# Patient Record
Sex: Female | Born: 1970 | Race: White | Hispanic: No | Marital: Married | Smoking: Never smoker
Health system: Southern US, Community
[De-identification: ages and names within clinical notes are randomized; demographics above are authoritative.]

## PROBLEM LIST (undated history)

## (undated) DIAGNOSIS — R51 Headache: Secondary | ICD-10-CM

## (undated) DIAGNOSIS — J45909 Unspecified asthma, uncomplicated: Secondary | ICD-10-CM

## (undated) DIAGNOSIS — F329 Major depressive disorder, single episode, unspecified: Secondary | ICD-10-CM

## (undated) DIAGNOSIS — IMO0001 Reserved for inherently not codable concepts without codable children: Secondary | ICD-10-CM

## (undated) DIAGNOSIS — M797 Fibromyalgia: Secondary | ICD-10-CM

## (undated) DIAGNOSIS — R519 Headache, unspecified: Secondary | ICD-10-CM

## (undated) DIAGNOSIS — E119 Type 2 diabetes mellitus without complications: Secondary | ICD-10-CM

## (undated) DIAGNOSIS — F419 Anxiety disorder, unspecified: Secondary | ICD-10-CM

## (undated) DIAGNOSIS — F32A Depression, unspecified: Secondary | ICD-10-CM

## (undated) DIAGNOSIS — K219 Gastro-esophageal reflux disease without esophagitis: Secondary | ICD-10-CM

## (undated) HISTORY — PX: TONSILLECTOMY: SUR1361

## (undated) HISTORY — PX: CHOLECYSTECTOMY: SHX55

---

## 1998-01-14 ENCOUNTER — Inpatient Hospital Stay (HOSPITAL_COMMUNITY): Admission: AD | Admit: 1998-01-14 | Discharge: 1998-01-17 | Payer: Self-pay | Admitting: Obstetrics & Gynecology

## 1998-03-11 ENCOUNTER — Other Ambulatory Visit: Admission: RE | Admit: 1998-03-11 | Discharge: 1998-03-11 | Payer: Self-pay | Admitting: Obstetrics and Gynecology

## 1999-12-12 ENCOUNTER — Other Ambulatory Visit: Admission: RE | Admit: 1999-12-12 | Discharge: 1999-12-12 | Payer: Self-pay | Admitting: Obstetrics and Gynecology

## 2015-07-15 NOTE — Patient Instructions (Signed)
Your procedure is scheduled on:  Wednesday, July 28, 2015  [facility]:  6:00 AM  Pick up the phone at the desk and dial [phone].  Call this number if you have problems the morning of surgery: [phone].  Remember:  Do NOT eat food or drink after:  Midnight Tuesday July 27, 2015  Take these medicines the morning of surgery with a SIP OF WATER: Wellbutrin, Cymbalta, Use Asthma inhalers on regular schedule  Bring Asthma inhalers day of surgery  Do NOT wear jewelry (body piercing), metal hair clips/bobby pins, make-up, or nail polish. Do NOT wear lotions, powders, or perfumes.  You may wear deodorant. Do NOT shave for 48 hours prior to surgery. Do NOT bring valuables to the hospital. Contacts, dentures, or bridgework may not be worn into surgery. Leave suitcase in car.  After surgery it may be brought to your room.  For patients admitted to the hospital, checkout time is 11:00 AM the day of discharge.

## 2015-07-19 ENCOUNTER — Encounter (HOSPITAL_COMMUNITY)
Admission: RE | Admit: 2015-07-19 | Discharge: 2015-07-19 | Disposition: A | Discharge status: Home / Self Care | Payer: BLUE CROSS/BLUE SHIELD | Source: Ambulatory Visit | Attending: Obstetrics and Gynecology | Admitting: Obstetrics and Gynecology

## 2015-07-19 ENCOUNTER — Encounter (HOSPITAL_COMMUNITY): Payer: Self-pay

## 2015-07-19 DIAGNOSIS — Z029 Encounter for administrative examinations, unspecified: Secondary | ICD-10-CM | POA: Insufficient documentation

## 2015-07-19 HISTORY — DX: Depression, unspecified: F32.A

## 2015-07-19 HISTORY — DX: Reserved for inherently not codable concepts without codable children: IMO0001

## 2015-07-19 HISTORY — DX: Headache: R51

## 2015-07-19 HISTORY — DX: Major depressive disorder, single episode, unspecified: F32.9

## 2015-07-19 HISTORY — DX: Gastro-esophageal reflux disease without esophagitis: K21.9

## 2015-07-19 HISTORY — DX: Unspecified asthma, uncomplicated: J45.909

## 2015-07-19 HISTORY — DX: Fibromyalgia: M79.7

## 2015-07-19 HISTORY — DX: Headache, unspecified: R51.9

## 2015-07-19 HISTORY — DX: Anxiety disorder, unspecified: F41.9

## 2015-07-19 LAB — CBC
HCT: 40.5 % (ref 36.0–46.0)
HEMOGLOBIN: 13.6 g/dL (ref 12.0–15.0)
MCH: 29.6 pg (ref 26.0–34.0)
MCHC: 33.6 g/dL (ref 30.0–36.0)
MCV: 88 fL (ref 78.0–100.0)
PLATELETS: 511 10*3/uL — AB (ref 150–400)
RBC: 4.6 MIL/uL (ref 3.87–5.11)
RDW: 14.1 % (ref 11.5–15.5)
WBC: 16.4 10*3/uL — ABNORMAL HIGH (ref 4.0–10.5)

## 2015-07-19 NOTE — Pre-Procedure Instructions (Signed)
CBC results routed to Dr. Jackelyn KnifeMeisinger via [facility]

## 2015-07-22 NOTE — Anesthesia Preprocedure Evaluation (Addendum)
Anesthesia Evaluation  Patient identified by MRN, date of birth, ID band Patient awake    Reviewed: Allergy & Precautions, NPO status , Patient's Chart, lab work & pertinent test results  Airway Mallampati: III       Dental  (+) Teeth Intact, Dental Advisory Given   Pulmonary neg pulmonary ROS,    breath sounds clear to auscultation       Cardiovascular negative cardio ROS   Rhythm:Regular     Neuro/Psych  Headaches, Anxiety Depression negative neurological ROS  negative psych ROS   GI/Hepatic negative GI ROS, Neg liver ROS, GERD  Medicated,  Endo/Other  negative endocrine ROSObesity, BMI 37  Renal/GU negative Renal ROS  negative genitourinary   Musculoskeletal negative musculoskeletal ROS (+) Fibromyalgia -  Abdominal (+) + obese,   Peds negative pediatric ROS (+)  Hematology negative hematology ROS (+)   Anesthesia Other Findings   Reproductive/Obstetrics negative OB ROS                          Anesthesia Physical Anesthesia Plan  ASA: II  Anesthesia Plan: General   Post-op Pain Management:    Induction: Intravenous  Airway Management Planned: Oral ETT  Additional Equipment:   Intra-op Plan:   Post-operative Plan: Extubation in OR  Informed Consent: I have reviewed the patients History and Physical, chart, labs and discussed the procedure including the risks, benefits and alternatives for the proposed anesthesia with the patient or authorized representative who has indicated his/her understanding and acceptance.     Plan Discussed with:   Anesthesia Plan Comments: (PG neg)      Anesthesia Quick Evaluation

## 2015-07-27 NOTE — H&P (Signed)
Meredith FergusonJennifer Olson is an 45 y.o. female. She was seen initially in February for PMB.  Menopausal for about 10 years, recent bleeding, normal Pap with endometrial cells by PCP.  Exam in the office was normal, pipelle benign.  She was treated with Aygestin, bleeding improved, but she then had recurrent bleeding and cramping, normal pelvic ultrasound.  Medical and surgical options have been discussed, she wants to proceed with definitive surgical therapy.    Pertinent Gynecological History: Last pap: normal Date: 2016 OB History: G3, P3003 SVD x 3   Menstrual History: No LMP recorded.    Past Medical History  Diagnosis Date  . Shortness of breath dyspnea     due to asthma  . Asthma   . Depression   . Anxiety   . GERD (gastroesophageal reflux disease)   . Headache     migraines  . Fibromyalgia     Past Surgical History  Procedure Laterality Date  . Cholecystectomy    . Tonsillectomy      No family history on file.  Social History:  reports that she has never smoked. She does not have any smokeless tobacco history on file. She reports that she does not drink alcohol or use illicit drugs.  Allergies:  Allergies  Allergen Reactions  . Latex Itching    Red rash  . Aspirin     Asthma flareup  . Ibuprofen     Asthma flare up  . Shellfish Allergy Hives    No prescriptions prior to admission    Review of Systems  Respiratory: Negative.   Cardiovascular: Negative.   Gastrointestinal: Negative.   Genitourinary: Negative.     There were no vitals taken for this visit. Physical Exam  Constitutional: She appears well-developed and well-nourished.  Neck: Neck supple. No thyromegaly present.  Cardiovascular: Normal rate, regular rhythm and normal heart sounds.   No murmur heard. Respiratory: Effort normal and breath sounds normal. No respiratory distress. She has no wheezes.  GI: Soft. She exhibits no distension and no mass. There is no tenderness.  Genitourinary: Vagina  normal and uterus normal.  No adnexal mass    No results found for this or any previous visit (from the past 24 hour(s)).  No results found.  Assessment/Plan: PMB and cramping, benign pipelle and normal ultrasound.  Medical and surgical options have been discussed, she wants definitive surgical therapy.  Surgical procedure, risks, chances of relieving symptoms have all been discussed.  She initially requested I remove her ovaries, discussed risks and benefits and that in general it is recommended to leave the ovaries in situ unless there is an abnormality.  [facility], possible cystoscopy.  She will make her final decision about whether or not to remove her ovaries on the day of surgery.  Gillian Kluever D 07/27/2015, 1:56 PM

## 2015-07-28 ENCOUNTER — Ambulatory Visit (HOSPITAL_COMMUNITY)
Admission: RE | Admit: 2015-07-28 | Discharge: 2015-07-29 | Disposition: A | Discharge status: Home / Self Care | Payer: BLUE CROSS/BLUE SHIELD | Source: Ambulatory Visit | Attending: Obstetrics and Gynecology | Admitting: Obstetrics and Gynecology

## 2015-07-28 ENCOUNTER — Encounter (HOSPITAL_COMMUNITY)
Admission: RE | Disposition: A | Discharge status: Home / Self Care | Payer: Self-pay | Source: Ambulatory Visit | Attending: Obstetrics and Gynecology

## 2015-07-28 ENCOUNTER — Ambulatory Visit (HOSPITAL_COMMUNITY): Payer: BLUE CROSS/BLUE SHIELD | Admitting: Anesthesiology

## 2015-07-28 DIAGNOSIS — Z6837 Body mass index (BMI) 37.0-37.9, adult: Secondary | ICD-10-CM | POA: Insufficient documentation

## 2015-07-28 DIAGNOSIS — N950 Postmenopausal bleeding: Secondary | ICD-10-CM | POA: Insufficient documentation

## 2015-07-28 DIAGNOSIS — M797 Fibromyalgia: Secondary | ICD-10-CM | POA: Diagnosis not present

## 2015-07-28 DIAGNOSIS — Z91013 Allergy to seafood: Secondary | ICD-10-CM | POA: Diagnosis not present

## 2015-07-28 DIAGNOSIS — Z90710 Acquired absence of both cervix and uterus: Secondary | ICD-10-CM | POA: Diagnosis present

## 2015-07-28 DIAGNOSIS — J45909 Unspecified asthma, uncomplicated: Secondary | ICD-10-CM | POA: Insufficient documentation

## 2015-07-28 DIAGNOSIS — Z91040 Latex allergy status: Secondary | ICD-10-CM | POA: Diagnosis not present

## 2015-07-28 DIAGNOSIS — E669 Obesity, unspecified: Secondary | ICD-10-CM | POA: Diagnosis not present

## 2015-07-28 DIAGNOSIS — G43909 Migraine, unspecified, not intractable, without status migrainosus: Secondary | ICD-10-CM | POA: Diagnosis not present

## 2015-07-28 DIAGNOSIS — F419 Anxiety disorder, unspecified: Secondary | ICD-10-CM | POA: Insufficient documentation

## 2015-07-28 DIAGNOSIS — N8302 Follicular cyst of left ovary: Secondary | ICD-10-CM | POA: Diagnosis not present

## 2015-07-28 DIAGNOSIS — Z886 Allergy status to analgesic agent status: Secondary | ICD-10-CM | POA: Diagnosis not present

## 2015-07-28 DIAGNOSIS — F329 Major depressive disorder, single episode, unspecified: Secondary | ICD-10-CM | POA: Insufficient documentation

## 2015-07-28 DIAGNOSIS — N840 Polyp of corpus uteri: Secondary | ICD-10-CM | POA: Insufficient documentation

## 2015-07-28 DIAGNOSIS — N888 Other specified noninflammatory disorders of cervix uteri: Secondary | ICD-10-CM | POA: Insufficient documentation

## 2015-07-28 DIAGNOSIS — N8301 Follicular cyst of right ovary: Secondary | ICD-10-CM | POA: Diagnosis not present

## 2015-07-28 DIAGNOSIS — K219 Gastro-esophageal reflux disease without esophagitis: Secondary | ICD-10-CM | POA: Insufficient documentation

## 2015-07-28 HISTORY — PX: VAGINAL HYSTERECTOMY: SHX2639

## 2015-07-28 HISTORY — PX: BILATERAL SALPINGECTOMY: SHX5743

## 2015-07-28 HISTORY — PX: CYSTOSCOPY: SHX5120

## 2015-07-28 LAB — PREGNANCY, URINE: Preg Test, Ur: NEGATIVE

## 2015-07-28 SURGERY — HYSTERECTOMY, VAGINAL
Anesthesia: General

## 2015-07-28 MED ORDER — LIDOCAINE HCL (CARDIAC) 20 MG/ML IV SOLN
INTRAVENOUS | Status: DC | PRN
  Administered 2015-07-28: 80 mg via INTRAVENOUS
  Administered 2015-07-28: 40 mg via INTRAVENOUS

## 2015-07-28 MED ORDER — FENTANYL CITRATE (PF) 100 MCG/2ML IJ SOLN
INTRAMUSCULAR | Status: AC
  Administered 2015-07-28: 50 ug via INTRAVENOUS
  Filled 2015-07-28: qty 2

## 2015-07-28 MED ORDER — FENTANYL CITRATE (PF) 250 MCG/5ML IJ SOLN
INTRAMUSCULAR | Status: AC
  Filled 2015-07-28: qty 5

## 2015-07-28 MED ORDER — VASOPRESSIN 20 UNIT/ML IV SOLN
INTRAVENOUS | Status: DC | PRN
  Administered 2015-07-28: 15 mL via INTRAMUSCULAR

## 2015-07-28 MED ORDER — BUPROPION HCL ER (XL) 150 MG PO TB24
150.0000 mg | ORAL_TABLET | Freq: Every day | ORAL | Status: DC
  Administered 2015-07-28: 150 mg via ORAL
  Filled 2015-07-28: qty 1

## 2015-07-28 MED ORDER — SUCCINYLCHOLINE CHLORIDE 20 MG/ML IJ SOLN
INTRAMUSCULAR | Status: DC | PRN
  Administered 2015-07-28: 120 mg via INTRAVENOUS

## 2015-07-28 MED ORDER — SUGAMMADEX SODIUM 200 MG/2ML IV SOLN
INTRAVENOUS | Status: AC
  Filled 2015-07-28: qty 2

## 2015-07-28 MED ORDER — ACETAMINOPHEN 10 MG/ML IV SOLN
1000.0000 mg | Freq: Once | INTRAVENOUS | Status: AC
  Administered 2015-07-28: 1000 mg via INTRAVENOUS
  Filled 2015-07-28: qty 100

## 2015-07-28 MED ORDER — SCOPOLAMINE 1 MG/3DAYS TD PT72
MEDICATED_PATCH | TRANSDERMAL | Status: AC
  Filled 2015-07-28: qty 1

## 2015-07-28 MED ORDER — PROPOFOL 10 MG/ML IV BOLUS
INTRAVENOUS | Status: AC
  Filled 2015-07-28: qty 20

## 2015-07-28 MED ORDER — DEXAMETHASONE SODIUM PHOSPHATE 10 MG/ML IJ SOLN
INTRAMUSCULAR | Status: DC | PRN
  Administered 2015-07-28: 4 mg via INTRAVENOUS

## 2015-07-28 MED ORDER — ONDANSETRON HCL 4 MG/2ML IJ SOLN: 4.0000 mg | Freq: Four times a day (QID) | INTRAMUSCULAR | Status: DC | PRN

## 2015-07-28 MED ORDER — DIPHENHYDRAMINE HCL 50 MG/ML IJ SOLN
INTRAMUSCULAR | Status: DC | PRN
  Administered 2015-07-28: 25 mg via INTRAVENOUS

## 2015-07-28 MED ORDER — ROCURONIUM BROMIDE 100 MG/10ML IV SOLN
INTRAVENOUS | Status: AC
  Filled 2015-07-28: qty 1

## 2015-07-28 MED ORDER — MOMETASONE FURO-FORMOTEROL FUM 100-5 MCG/ACT IN AERO
2.0000 | INHALATION_SPRAY | Freq: Two times a day (BID) | RESPIRATORY_TRACT | Status: DC
  Administered 2015-07-28 – 2015-07-29 (×2): 2 via RESPIRATORY_TRACT
  Filled 2015-07-28: qty 8.8

## 2015-07-28 MED ORDER — STERILE WATER FOR IRRIGATION IR SOLN
Status: DC | PRN
  Administered 2015-07-28: 3000 mL

## 2015-07-28 MED ORDER — MIDAZOLAM HCL 2 MG/2ML IJ SOLN
INTRAMUSCULAR | Status: AC
  Filled 2015-07-28: qty 2

## 2015-07-28 MED ORDER — MEPERIDINE HCL 25 MG/ML IJ SOLN: 6.2500 mg | INTRAMUSCULAR | Status: DC | PRN

## 2015-07-28 MED ORDER — DIPHENHYDRAMINE HCL 50 MG/ML IJ SOLN
INTRAMUSCULAR | Status: AC
  Filled 2015-07-28: qty 1

## 2015-07-28 MED ORDER — SCOPOLAMINE 1 MG/3DAYS TD PT72
1.0000 | MEDICATED_PATCH | Freq: Once | TRANSDERMAL | Status: DC
  Administered 2015-07-28: 1.5 mg via TRANSDERMAL

## 2015-07-28 MED ORDER — ROCURONIUM BROMIDE 100 MG/10ML IV SOLN
INTRAVENOUS | Status: DC | PRN
  Administered 2015-07-28: 10 mg via INTRAVENOUS
  Administered 2015-07-28: 5 mg via INTRAVENOUS

## 2015-07-28 MED ORDER — PROPOFOL 10 MG/ML IV BOLUS
INTRAVENOUS | Status: DC | PRN
  Administered 2015-07-28: 200 mg via INTRAVENOUS

## 2015-07-28 MED ORDER — ONDANSETRON HCL 4 MG PO TABS: 4.0000 mg | ORAL_TABLET | Freq: Four times a day (QID) | ORAL | Status: DC | PRN

## 2015-07-28 MED ORDER — LACTATED RINGERS IV SOLN
INTRAVENOUS | Status: DC
  Administered 2015-07-28 (×2): via INTRAVENOUS

## 2015-07-28 MED ORDER — SIMETHICONE 80 MG PO CHEW: 80.0000 mg | CHEWABLE_TABLET | Freq: Four times a day (QID) | ORAL | Status: DC | PRN

## 2015-07-28 MED ORDER — LIDOCAINE HCL (CARDIAC) 20 MG/ML IV SOLN
INTRAVENOUS | Status: AC
  Filled 2015-07-28: qty 5

## 2015-07-28 MED ORDER — ONDANSETRON HCL 4 MG/2ML IJ SOLN
INTRAMUSCULAR | Status: DC | PRN
  Administered 2015-07-28: 4 mg via INTRAVENOUS

## 2015-07-28 MED ORDER — ALBUTEROL SULFATE (2.5 MG/3ML) 0.083% IN NEBU: 3.0000 mL | INHALATION_SOLUTION | Freq: Four times a day (QID) | RESPIRATORY_TRACT | Status: DC | PRN

## 2015-07-28 MED ORDER — CEFAZOLIN SODIUM-DEXTROSE 2-3 GM-% IV SOLR
INTRAVENOUS | Status: AC
  Filled 2015-07-28: qty 50

## 2015-07-28 MED ORDER — DEXAMETHASONE SODIUM PHOSPHATE 4 MG/ML IJ SOLN
INTRAMUSCULAR | Status: AC
  Filled 2015-07-28: qty 1

## 2015-07-28 MED ORDER — SODIUM CHLORIDE 0.9 % IJ SOLN
INTRAMUSCULAR | Status: DC | PRN
  Administered 2015-07-28: 50 mL

## 2015-07-28 MED ORDER — SUCCINYLCHOLINE CHLORIDE 20 MG/ML IJ SOLN
INTRAMUSCULAR | Status: AC
  Filled 2015-07-28: qty 1

## 2015-07-28 MED ORDER — SCOPOLAMINE 1 MG/3DAYS TD PT72
MEDICATED_PATCH | TRANSDERMAL | Status: AC
  Administered 2015-07-28: 1.5 mg via TRANSDERMAL
  Filled 2015-07-28: qty 1

## 2015-07-28 MED ORDER — ALUM & MAG HYDROXIDE-SIMETH 200-200-20 MG/5ML PO SUSP: 30.0000 mL | ORAL | Status: DC | PRN

## 2015-07-28 MED ORDER — MENTHOL 3 MG MT LOZG: 1.0000 | LOZENGE | OROMUCOSAL | Status: DC | PRN

## 2015-07-28 MED ORDER — DEXTROSE-NACL 5-0.45 % IV SOLN
INTRAVENOUS | Status: DC
  Administered 2015-07-28 (×2): via INTRAVENOUS

## 2015-07-28 MED ORDER — OXYCODONE-ACETAMINOPHEN 5-325 MG PO TABS
1.0000 | ORAL_TABLET | ORAL | Status: DC | PRN
  Administered 2015-07-28 (×2): 1 via ORAL
  Administered 2015-07-28: 2 via ORAL
  Administered 2015-07-29: 1 via ORAL
  Filled 2015-07-28: qty 2
  Filled 2015-07-28 (×3): qty 1

## 2015-07-28 MED ORDER — ONDANSETRON HCL 4 MG/2ML IJ SOLN: 4.0000 mg | Freq: Once | INTRAMUSCULAR | Status: DC

## 2015-07-28 MED ORDER — SODIUM CHLORIDE 0.9 % IJ SOLN
INTRAMUSCULAR | Status: AC
  Filled 2015-07-28: qty 50

## 2015-07-28 MED ORDER — CEFAZOLIN SODIUM-DEXTROSE 2-4 GM/100ML-% IV SOLN
2.0000 g | INTRAVENOUS | Status: AC
  Administered 2015-07-28: 2 g via INTRAVENOUS
  Filled 2015-07-28: qty 100

## 2015-07-28 MED ORDER — HYDROMORPHONE HCL 1 MG/ML IJ SOLN: 1.0000 mg | INTRAMUSCULAR | Status: DC | PRN

## 2015-07-28 MED ORDER — ONDANSETRON HCL 4 MG/2ML IJ SOLN
INTRAMUSCULAR | Status: AC
  Filled 2015-07-28: qty 2

## 2015-07-28 MED ORDER — FENTANYL CITRATE (PF) 100 MCG/2ML IJ SOLN
INTRAMUSCULAR | Status: DC | PRN
  Administered 2015-07-28 (×2): 100 ug via INTRAVENOUS

## 2015-07-28 MED ORDER — VASOPRESSIN 20 UNIT/ML IV SOLN
INTRAVENOUS | Status: AC
  Filled 2015-07-28: qty 1

## 2015-07-28 MED ORDER — MONTELUKAST SODIUM 10 MG PO TABS
10.0000 mg | ORAL_TABLET | Freq: Every day | ORAL | Status: DC
  Administered 2015-07-28: 10 mg via ORAL
  Filled 2015-07-28: qty 1

## 2015-07-28 MED ORDER — RACEPINEPHRINE HCL 2.25 % IN NEBU
INHALATION_SOLUTION | RESPIRATORY_TRACT | Status: AC
  Filled 2015-07-28: qty 0.5

## 2015-07-28 MED ORDER — MIDAZOLAM HCL 2 MG/2ML IJ SOLN
INTRAMUSCULAR | Status: DC | PRN
  Administered 2015-07-28 (×2): 1 mg via INTRAVENOUS

## 2015-07-28 MED ORDER — BUPIVACAINE HCL (PF) 0.5 % IJ SOLN
INTRAMUSCULAR | Status: DC | PRN
  Administered 2015-07-28: 30 mL

## 2015-07-28 MED ORDER — DULOXETINE HCL 60 MG PO CPEP
60.0000 mg | ORAL_CAPSULE | Freq: Every day | ORAL | Status: DC
  Administered 2015-07-28 – 2015-07-29 (×2): 60 mg via ORAL
  Filled 2015-07-28 (×2): qty 1

## 2015-07-28 MED ORDER — BUPIVACAINE HCL (PF) 0.5 % IJ SOLN
INTRAMUSCULAR | Status: AC
  Filled 2015-07-28: qty 30

## 2015-07-28 MED ORDER — FENTANYL CITRATE (PF) 100 MCG/2ML IJ SOLN
25.0000 ug | INTRAMUSCULAR | Status: DC | PRN
  Administered 2015-07-28 (×2): 50 ug via INTRAVENOUS

## 2015-07-28 MED ORDER — SUGAMMADEX SODIUM 200 MG/2ML IV SOLN
INTRAVENOUS | Status: DC | PRN
  Administered 2015-07-28: 200 mg via INTRAVENOUS

## 2015-07-28 SURGICAL SUPPLY — 28 items
CANISTER SUCT 3000ML (MISCELLANEOUS) ×3 IMPLANT
CATH ROBINSON RED A/P 16FR (CATHETERS) ×6 IMPLANT
CLOTH BEACON ORANGE TIMEOUT ST (SAFETY) ×3 IMPLANT
CONT PATH 16OZ SNAP LID 3702 (MISCELLANEOUS) IMPLANT
DECANTER SPIKE VIAL GLASS SM (MISCELLANEOUS) ×1 IMPLANT
GLOVE BIO SURGEON STRL SZ8 (GLOVE) ×3 IMPLANT
GLOVE BIOGEL PI IND STRL 6.5 (GLOVE) ×2 IMPLANT
GLOVE BIOGEL PI IND STRL 7.0 (GLOVE) ×2 IMPLANT
GLOVE BIOGEL PI INDICATOR 6.5 (GLOVE) ×1
GLOVE BIOGEL PI INDICATOR 7.0 (GLOVE) ×1
GLOVE ORTHO TXT STRL SZ7.5 (GLOVE) ×3 IMPLANT
GOWN SPEC L3 XXLG W/TWL (GOWN DISPOSABLE) ×3 IMPLANT
GOWN STRL REUS W/TWL LRG LVL3 (GOWN DISPOSABLE) ×9 IMPLANT
NS IRRIG 1000ML POUR BTL (IV SOLUTION) ×3 IMPLANT
PACK VAGINAL WOMENS (CUSTOM PROCEDURE TRAY) ×3 IMPLANT
PAD OB MATERNITY 4.3X12.25 (PERSONAL CARE ITEMS) ×3 IMPLANT
SET CYSTO W/LG BORE CLAMP LF (SET/KITS/TRAYS/PACK) ×3 IMPLANT
SHEARS FOC LG CVD HARMONIC 17C (MISCELLANEOUS) ×3 IMPLANT
SUT CHROMIC 1 TIES 18 (SUTURE) ×3 IMPLANT
SUT CHROMIC 1MO 4 18 CR8 (SUTURE) ×6 IMPLANT
SUT SILK 2 0 SH (SUTURE) ×3 IMPLANT
SUT VIC AB 2-0 CT1 (SUTURE) ×3 IMPLANT
SUT VIC AB 2-0 CT1 27 (SUTURE) ×3
SUT VIC AB 2-0 CT1 TAPERPNT 27 (SUTURE) ×2 IMPLANT
SUT VIC AB 3-0 SH 27 (SUTURE)
SUT VIC AB 3-0 SH 27X BRD (SUTURE) IMPLANT
TOWEL OR 17X24 6PK STRL BLUE (TOWEL DISPOSABLE) ×6 IMPLANT
WATER STERILE IRR 1000ML POUR (IV SOLUTION) ×3 IMPLANT

## 2015-07-28 NOTE — Op Note (Signed)
Preoperative diagnosis:  PMB, pelvic pain Postop diagnosis:  Same Procedure: Total vaginal hysterectomy, BSO and cystoscopy Surgeon: Lavina Hammanodd Embry Huss M.D. Assistant: Doristine Counterecelia Banga, D.O. Anesthesia: Gen. Findings: Patient had a small uterus with normal tubes and ovaries. Via cystoscopy bladder was normal and both ureters were patent Estimated blood loss: 100 cc Specimens: Uterus, tubes and ovaries for routine pathology Complications: None  Procedure in detail: The patient was taken to the operating room and placed in the dorsosupine position. General anesthesia was induced and she was placed in mobile stirrups. Legs were elevated in the stirrups. Perineum and vagina were then prepped and draped in the usual sterile fashion, bladder drained with red Robinson catheter. A Graves speculum was inserted in the vagina and the cervix was grasped with Christella HartiganJacobs tenaculums. Dilute Pitressin with Marcaine was then instilled at the cervicovaginal junction which was then incised circumferentially with electrocautery. Sharp dissection was then used to further free the vagina from the cervix. Anterior peritoneum was identified and entered sharply. A Deaver retractor was used to retract the bladder anteriorly. Posterior cul-de-sac was identified and entered sharply. A Bonnano speculum was placed into the posterior cul-de-sac. Uterosacral ligaments were clamped transected and ligated with #1 chromic and tagged for later use. The remaining pedicles were taken down with the Harmonic scalpel, easily freeing the uterus.  Small bleeders controlled with Harmonic scalpel. Tubes and ovaries were inspected and found to be normal. I was able to bring them into the field and come across the mesosalpinx and infundibulopelvic pedicles with harmonic scalpel to free the tubes and ovaries with adequate hemostasis.  The right ovary was difficult to get into the field, but I believe I was able to remove the entire ovary.  All pedicles were  inspected and found to be hemostatic. The Bonnano speculum was removed and a shorter vaginal speculum was placed. The uterosacral ligaments were then plicated in the midline with 2-0 silk. The previously tagged uterosacral pedicles were also tied in the midline. No significant bleeding was identified. The vagina was then closed in a vertical fashion with running locking 2-0 Vicryl with adequate closure and adequate hemostasis.  Attention was turned to cystoscopy. A 70 cystoscope was inserted and 100 cc of fluid was instilled. The bladder appeared normal. Both ureteral orifices were easily identified and urine was seen to flow freely from each orifice. The cystoscope was removed and the bladder was drained and the catheter removed. The patient was taken down from stirrups. She was awakened in the operating room and taken to the recovery room in stable condition after tolerating the procedure well. Counts were correct, she had PAS hose on throughout the procedure, she received Ancef 2 gms IV preop.

## 2015-07-28 NOTE — Anesthesia Procedure Notes (Signed)
Procedure Name: Intubation Date/Time: 07/28/2015 7:33 AM Performed by: Cephus ShellingBURGER, Lailana Shira A Pre-anesthesia Checklist: Patient identified, Emergency Drugs available, Suction available and Patient being monitored Patient Re-evaluated:Patient Re-evaluated prior to inductionOxygen Delivery Method: Circle system utilized Preoxygenation: Pre-oxygenation with 100% oxygen Intubation Type: IV induction, Inhalational induction and Cricoid Pressure applied Ventilation: Mask ventilation without difficulty Laryngoscope Size: Mac and 3 Grade View: Grade III Tube type: Oral Tube size: 7.0 mm Number of attempts: 1 Airway Equipment and Method: Patient positioned with wedge pillow and Stylet Placement Confirmation: positive ETCO2 and breath sounds checked- equal and bilateral Secured at: 21 cm Tube secured with: Tape Dental Injury: Teeth and Oropharynx as per pre-operative assessment  Difficulty Due To: Difficult Airway- due to anterior larynx Future Recommendations: Recommend- induction with short-acting agent, and alternative techniques readily available Comments: Only tip of epiglottis visualized.

## 2015-07-28 NOTE — Anesthesia Postprocedure Evaluation (Signed)
Anesthesia Post Note  Patient: Meredith FergusonJennifer Olson  Procedure(s) Performed: Procedure(s) (LRB): HYSTERECTOMY VAGINAL (N/A) BILATERAL SALPINGO-OOPHORECTOMY (Bilateral) CYSTOSCOPY (N/A)  Patient location during evaluation: [facility] Anesthesia Type: General Level of consciousness: awake and alert and oriented Pain management: pain level controlled Vital Signs Assessment: post-procedure vital signs reviewed and stable Respiratory status: spontaneous breathing and patient connected to nasal cannula oxygen Cardiovascular status: blood pressure returned to baseline Postop Assessment: no signs of nausea or vomiting and adequate PO intake Anesthetic complications: no     Last Vitals:  Filed Vitals:   07/28/15 1105 07/28/15 1231  BP: 112/64 109/67  Pulse: 81 78  Temp: 36.7 C 36.8 C  Resp: 16 18    Last Pain:  Filed Vitals:   07/28/15 1345  PainSc: 5    Pain Goal: Patients Stated Pain Goal: 4 (07/28/15 1105)               Athel Merriweather

## 2015-07-28 NOTE — Interval H&P Note (Signed)
History and Physical Interval Note:  07/28/2015 7:11 AM  [name]  has presented today for surgery, with the diagnosis of PMB  The various methods of treatment have been discussed with the patient and family. After consideration of risks, benefits and other options for treatment, the patient has consented to  Procedure(s) with comments: HYSTERECTOMY VAGINAL (N/A) - MD needs 1.5hrs OR time BILATERAL SALPINGECTOMY (Bilateral) CYSTOSCOPY (N/A) as a surgical intervention .  She wants me to remove her ovaries if at all possible.  The patient's history has been reviewed, patient examined, no change in status, stable for surgery.  I have reviewed the patient's chart and labs.  Questions were answered to the patient's satisfaction.     Selig Wampole D

## 2015-07-28 NOTE — Anesthesia Postprocedure Evaluation (Signed)
Anesthesia Post Note  Patient: Meredith FergusonJennifer Olson  Procedure(s) Performed: Procedure(s) (LRB): HYSTERECTOMY VAGINAL (N/A) BILATERAL SALPINGO-OOPHORECTOMY (Bilateral) CYSTOSCOPY (N/A)  Patient location during evaluation: PACU Anesthesia Type: General Level of consciousness: awake and alert Pain management: pain level controlled Vital Signs Assessment: post-procedure vital signs reviewed and stable Respiratory status: patient connected to face mask oxygen Cardiovascular status: blood pressure returned to baseline and stable Postop Assessment: no signs of nausea or vomiting Anesthetic complications: no     Last Vitals:  Filed Vitals:   07/28/15 0900 07/28/15 0915  BP: 127/73 116/84  Pulse: 99 94  Temp:    Resp: 21 24    Last Pain:  Filed Vitals:   07/28/15 0919  PainSc: 3    Pain Goal:                 Sebastian Acheheodore Rylee Nuzum

## 2015-07-28 NOTE — Addendum Note (Signed)
Addendum  created 07/28/15 1409 by Jhonnie GarnerBeth M Mann Skaggs, CRNA   Modules edited: Clinical Notes   Clinical Notes:  File: [id number]

## 2015-07-28 NOTE — OR Nursing (Signed)
Nasal trumpet dc per dr Berneice Heinrichmanny order.Juanitta Earnhardt rn

## 2015-07-28 NOTE — Transfer of Care (Signed)
Immediate Anesthesia Transfer of Care Note  Patient: Meredith FergusonJennifer Olson  Procedure(s) Performed: Procedure(s) with comments: HYSTERECTOMY VAGINAL (N/A) - MD needs 1.5hrs OR time BILATERAL SALPINGO-OOPHORECTOMY (Bilateral) CYSTOSCOPY (N/A)  Patient Location: PACU  Anesthesia Type:General  Level of Consciousness: sedated  Airway & Oxygen Therapy: Patient Spontanous Breathing and Patient connected to face mask oxygen  Post-op Assessment: Report given to RN  Post vital signs: Reviewed and stable  Last Vitals:  Filed Vitals:   07/28/15 0558  BP: 122/93  Pulse: 80  Temp: 36.7 C  Resp: 20    Last Pain:  Filed Vitals:   07/28/15 0602  PainSc: 3          Complications: No apparent anesthesia complications

## 2015-07-28 NOTE — Progress Notes (Signed)
Day of Surgery Procedure(s) (LRB): HYSTERECTOMY VAGINAL (N/A) BILATERAL SALPINGO-OOPHORECTOMY (Bilateral) CYSTOSCOPY (N/A)  Subjective: Patient reports tolerating PO and no problems voiding.  Pain reasonable, but feels has increased a bit, just got medicated.  Ambulating   Objective: I have reviewed patient's vital signs and intake and output.  General: alert and cooperative GI: soft NT  Assessment: s/p Procedure(s) with comments: HYSTERECTOMY VAGINAL (N/A) - MD needs 1.5hrs OR time BILATERAL SALPINGO-OOPHORECTOMY (Bilateral) CYSTOSCOPY (N/A): progressing well  Plan: Continue care and plan d/c in AM     Freddie Nghiem W 07/28/2015, 5:35 PM

## 2015-07-28 NOTE — OR Nursing (Signed)
Upon arrival to pacu pt bluish face. sats 60"s. Face mask applied at 15liters, linda burger crna and dr Berneice Heinrichmanny at bedside. Pt sats immed start increasing up to 70's  Then quickly up to 80's,. approx a minute later up to 90's. ambu ready but not needed.   Dr Berneice Heinrichmanny puts 8.0 Nasopharyngeal airway in right nostril. Pt sats up to high 90's pt awaking up and answering questions.  Color pink v/s stable. Lungs clearanterially.Laini Urick rn

## 2015-07-29 ENCOUNTER — Encounter (HOSPITAL_COMMUNITY): Payer: Self-pay | Admitting: Obstetrics and Gynecology

## 2015-07-29 DIAGNOSIS — N950 Postmenopausal bleeding: Secondary | ICD-10-CM | POA: Diagnosis not present

## 2015-07-29 LAB — CBC
HEMATOCRIT: 37 % (ref 36.0–46.0)
HEMOGLOBIN: 11.8 g/dL — AB (ref 12.0–15.0)
MCH: 28.2 pg (ref 26.0–34.0)
MCHC: 31.9 g/dL (ref 30.0–36.0)
MCV: 88.3 fL (ref 78.0–100.0)
Platelets: 482 10*3/uL — ABNORMAL HIGH (ref 150–400)
RBC: 4.19 MIL/uL (ref 3.87–5.11)
RDW: 14.2 % (ref 11.5–15.5)
WBC: 15.7 10*3/uL — ABNORMAL HIGH (ref 4.0–10.5)

## 2015-07-29 MED ORDER — OXYCODONE-ACETAMINOPHEN 5-325 MG PO TABS: 1.0000 | ORAL_TABLET | ORAL | Status: DC | PRN

## 2015-07-29 NOTE — Discharge Instructions (Signed)
Routine instructions for hysterectomy °

## 2015-07-29 NOTE — Progress Notes (Signed)
Pt Discharged home with husband. Discharge instructions reviewed with patient, verbalized understanding, inhaler sent home with pt. Discharged in stable condition, no equipment sent home with pt. Pt to car via wheelchair by Christena FlakeSara Bertrand.

## 2015-07-29 NOTE — Progress Notes (Signed)
1 Day Post-Op Procedure(s) (LRB): HYSTERECTOMY VAGINAL (N/A) BILATERAL SALPINGO-OOPHORECTOMY (Bilateral) CYSTOSCOPY (N/A)  Subjective: Patient reports incisional pain, tolerating PO and no problems voiding.    Objective: I have reviewed patient's vital signs, intake and output and labs.  General: alert GI: soft  CBC Latest Ref Rng 07/29/2015 07/19/2015  WBC 4.0 - 10.5 K/uL 15.7(H) 16.4(H)  Hemoglobin 12.0 - 15.0 g/dL 11.8(L) 13.6  Hematocrit 36.0 - 46.0 % 37.0 40.5  Platelets 150 - 400 K/uL 482(H) 511(H)     Assessment: s/p Procedure(s) with comments: HYSTERECTOMY VAGINAL (N/A) - MD needs 1.5hrs OR time BILATERAL SALPINGO-OOPHORECTOMY (Bilateral) CYSTOSCOPY (N/A): stable, progressing well and tolerating diet  Plan: Discharge home     Seeley Hissong D 07/29/2015, 8:05 AM

## 2015-07-29 NOTE — Discharge Summary (Signed)
Physician Discharge Summary  Patient ID: Meredith FergusonJennifer Barajas MRN: [id number] DOB/AGE: February 18, 1971 45 y.o.  Admit date: 07/28/2015 Discharge date: 07/29/2015  Admission Diagnoses:  PMB, pelvic pain  Discharge Diagnoses: Same Active Problems:   S/P vaginal hysterectomy   Discharged Condition: good  Hospital Course: Pt admitted for TVH/BSO for above issues.  Surgery with general anesthesia, 100cc EBL, no problems.  She did have some respiratory issues immediately after surgery, but other than that rapidly ambulated and voided, tolerated diet, stable for discharge POD #1.   Discharge Exam: Blood pressure 91/51, pulse 68, temperature 98.4 F (36.9 C), temperature source Oral, resp. rate 16, SpO2 99 %. General appearance: alert  Disposition:   Discharge Instructions    Diet - low sodium heart healthy    Complete by:  As directed      Increase activity slowly    Complete by:  As directed      Lifting restrictions    Complete by:  As directed   10 lbs     Sexual Activity Restrictions    Complete by:  As directed   Pelvic rest            Medication List    TAKE these medications        albuterol 108 (90 Base) MCG/ACT inhaler  Commonly known as:  PROVENTIL HFA;VENTOLIN HFA  Inhale 1-2 puffs into the lungs every 6 (six) hours as needed for wheezing or shortness of breath.     budesonide-formoterol 80-4.5 MCG/ACT inhaler  Commonly known as:  SYMBICORT  Inhale 2 puffs into the lungs 2 (two) times daily.     buPROPion 150 MG 24 hr tablet  Commonly known as:  WELLBUTRIN XL  Take 150 mg by mouth daily.     clobetasol 0.05 % external solution  Commonly known as:  TEMOVATE  Apply 1 application topically daily as needed (for eczema).     DRY EYES OP  Apply 2 drops to eye daily as needed (for allergies or dry eyes).     DULoxetine 60 MG capsule  Commonly known as:  CYMBALTA  Take 60 mg by mouth daily.     ketoconazole 2 % shampoo  Commonly known as:  NIZORAL  Apply 1  application topically 2 (two) times a week.     montelukast 10 MG tablet  Commonly known as:  SINGULAIR  Take 10 mg by mouth at bedtime.     oxyCODONE-acetaminophen 5-325 MG tablet  Commonly known as:  PERCOCET/ROXICET  Take 1-2 tablets by mouth every 4 (four) hours as needed for severe pain (moderate to severe pain (when tolerating fluids)).     Vitamin D (Ergocalciferol) 50000 units Caps capsule  Commonly known as:  DRISDOL  Take 50,000 Units by mouth every 7 (seven) days. thursday           Follow-up Information    Follow up with Michalina Calbert D, MD. Schedule an appointment as soon as possible for a visit in 6 weeks.   Specialty:  Obstetrics and Gynecology   Contact information:   [address], [address] [place] [address] [phone]       Signed: Zenaida NieceMEISINGER,Luisdavid Hamblin D 07/29/2015, 8:08 AM

## 2019-01-08 ENCOUNTER — Inpatient Hospital Stay (HOSPITAL_COMMUNITY)
Admission: AD | Admit: 2019-01-08 | Discharge: 2019-01-14 | DRG: 177 | Disposition: A | Discharge status: Home / Self Care | Payer: HRSA Program | Source: Other Acute Inpatient Hospital | Attending: Internal Medicine | Admitting: Internal Medicine

## 2019-01-08 ENCOUNTER — Encounter (HOSPITAL_COMMUNITY): Payer: Self-pay

## 2019-01-08 ENCOUNTER — Other Ambulatory Visit: Payer: Self-pay

## 2019-01-08 DIAGNOSIS — F329 Major depressive disorder, single episode, unspecified: Secondary | ICD-10-CM | POA: Diagnosis present

## 2019-01-08 DIAGNOSIS — E119 Type 2 diabetes mellitus without complications: Secondary | ICD-10-CM | POA: Diagnosis present

## 2019-01-08 DIAGNOSIS — J129 Viral pneumonia, unspecified: Secondary | ICD-10-CM

## 2019-01-08 DIAGNOSIS — M797 Fibromyalgia: Secondary | ICD-10-CM | POA: Diagnosis present

## 2019-01-08 DIAGNOSIS — Z6837 Body mass index (BMI) 37.0-37.9, adult: Secondary | ICD-10-CM

## 2019-01-08 DIAGNOSIS — E669 Obesity, unspecified: Secondary | ICD-10-CM | POA: Diagnosis present

## 2019-01-08 DIAGNOSIS — J9691 Respiratory failure, unspecified with hypoxia: Secondary | ICD-10-CM

## 2019-01-08 DIAGNOSIS — R0600 Dyspnea, unspecified: Secondary | ICD-10-CM

## 2019-01-08 DIAGNOSIS — R7401 Elevation of levels of liver transaminase levels: Secondary | ICD-10-CM

## 2019-01-08 DIAGNOSIS — U071 COVID-19: Principal | ICD-10-CM | POA: Diagnosis present

## 2019-01-08 DIAGNOSIS — J1282 Pneumonia due to coronavirus disease 2019: Secondary | ICD-10-CM | POA: Diagnosis present

## 2019-01-08 DIAGNOSIS — J45909 Unspecified asthma, uncomplicated: Secondary | ICD-10-CM | POA: Diagnosis present

## 2019-01-08 DIAGNOSIS — Z886 Allergy status to analgesic agent status: Secondary | ICD-10-CM

## 2019-01-08 DIAGNOSIS — Z91040 Latex allergy status: Secondary | ICD-10-CM

## 2019-01-08 DIAGNOSIS — E109 Type 1 diabetes mellitus without complications: Secondary | ICD-10-CM | POA: Diagnosis not present

## 2019-01-08 DIAGNOSIS — J9601 Acute respiratory failure with hypoxia: Secondary | ICD-10-CM | POA: Diagnosis present

## 2019-01-08 DIAGNOSIS — J1289 Other viral pneumonia: Secondary | ICD-10-CM | POA: Diagnosis present

## 2019-01-08 LAB — COMPREHENSIVE METABOLIC PANEL
ALT: 95 U/L — ABNORMAL HIGH (ref 0–44)
AST: 97 U/L — ABNORMAL HIGH (ref 15–41)
Albumin: 3.9 g/dL (ref 3.5–5.0)
Alkaline Phosphatase: 84 U/L (ref 38–126)
Anion gap: 10 (ref 5–15)
BUN: 15 mg/dL (ref 6–20)
CO2: 25 mmol/L (ref 22–32)
Calcium: 8.7 mg/dL — ABNORMAL LOW (ref 8.9–10.3)
Chloride: 101 mmol/L (ref 98–111)
Creatinine, Ser: 0.83 mg/dL (ref 0.44–1.00)
GFR calc Af Amer: >60 mL/min (ref >60)
GFR calc non Af Amer: >60 mL/min (ref >60)
Glucose, Bld: 172 mg/dL — ABNORMAL HIGH (ref 70–99)
Potassium: 3.5 mmol/L (ref 3.5–5.1)
Sodium: 136 mmol/L (ref 135–145)
Total Bilirubin: 0.4 mg/dL (ref 0.3–1.2)
Total Protein: 7.5 g/dL (ref 6.5–8.1)

## 2019-01-08 LAB — CBC WITH DIFFERENTIAL/PLATELET
Abs Immature Granulocytes: 0.02 10*3/uL (ref 0.00–0.07)
Basophils Absolute: 0 10*3/uL (ref 0.0–0.1)
Basophils Relative: 0 %
Eosinophils Absolute: 0 10*3/uL (ref 0.0–0.5)
Eosinophils Relative: 0 %
HCT: 42.9 % (ref 36.0–46.0)
Hemoglobin: 13.9 g/dL (ref 12.0–15.0)
Immature Granulocytes: 0 %
Lymphocytes Relative: 20 %
Lymphs Abs: 1 10*3/uL (ref 0.7–4.0)
MCH: 28.7 pg (ref 26.0–34.0)
MCHC: 32.4 g/dL (ref 30.0–36.0)
MCV: 88.5 fL (ref 80.0–100.0)
Monocytes Absolute: 0.3 10*3/uL (ref 0.1–1.0)
Monocytes Relative: 7 %
Neutro Abs: 3.8 10*3/uL (ref 1.7–7.7)
Neutrophils Relative %: 73 %
Platelets: 423 10*3/uL — ABNORMAL HIGH (ref 150–400)
RBC: 4.85 MIL/uL (ref 3.87–5.11)
RDW: 13.7 % (ref 11.5–15.5)
WBC: 5.2 10*3/uL (ref 4.0–10.5)
nRBC: 0 % (ref 0.0–0.2)

## 2019-01-08 LAB — ABO/RH: ABO/RH(D): O POS

## 2019-01-08 LAB — PROCALCITONIN: Procalcitonin: <0.1 ng/mL

## 2019-01-08 LAB — FERRITIN: Ferritin: 129 ng/mL (ref 11–307)

## 2019-01-08 LAB — C-REACTIVE PROTEIN: CRP: 4.1 mg/dL — ABNORMAL HIGH (ref <1.0)

## 2019-01-08 LAB — HIV ANTIBODY (ROUTINE TESTING W REFLEX): HIV Screen 4th Generation wRfx: NONREACTIVE

## 2019-01-08 LAB — GLUCOSE, CAPILLARY: Glucose-Capillary: 250 mg/dL — ABNORMAL HIGH (ref 70–99)

## 2019-01-08 LAB — D-DIMER, QUANTITATIVE: D-Dimer, Quant: 0.6 ug/mL-FEU — ABNORMAL HIGH (ref 0.00–0.50)

## 2019-01-08 MED ORDER — VITAMIN C 500 MG PO TABS
500.0000 mg | ORAL_TABLET | Freq: Every day | ORAL | Status: DC
  Administered 2019-01-08 – 2019-01-14 (×7): 500 mg via ORAL
  Filled 2019-01-08 (×9): qty 1

## 2019-01-08 MED ORDER — ACETAMINOPHEN 325 MG PO TABS
650.0000 mg | ORAL_TABLET | Freq: Four times a day (QID) | ORAL | Status: DC | PRN
  Administered 2019-01-08 – 2019-01-13 (×8): 650 mg via ORAL
  Filled 2019-01-08 (×8): qty 2

## 2019-01-08 MED ORDER — MAGNESIUM HYDROXIDE 400 MG/5ML PO SUSP
30.0000 mL | Freq: Every day | ORAL | Status: DC | PRN
  Filled 2019-01-08: qty 30

## 2019-01-08 MED ORDER — INSULIN ASPART 100 UNIT/ML ~~LOC~~ SOLN
0.0000 [IU] | Freq: Three times a day (TID) | SUBCUTANEOUS | Status: DC
  Administered 2019-01-09: 3 [IU] via SUBCUTANEOUS
  Administered 2019-01-09 – 2019-01-11 (×3): 1 [IU] via SUBCUTANEOUS
  Administered 2019-01-11: 2 [IU] via SUBCUTANEOUS
  Administered 2019-01-12 – 2019-01-13 (×3): 1 [IU] via SUBCUTANEOUS
  Administered 2019-01-13: 2 [IU] via SUBCUTANEOUS
  Administered 2019-01-13: 1 [IU] via SUBCUTANEOUS

## 2019-01-08 MED ORDER — ONDANSETRON HCL 4 MG PO TABS: 4.0000 mg | ORAL_TABLET | Freq: Four times a day (QID) | ORAL | Status: DC | PRN

## 2019-01-08 MED ORDER — GUAIFENESIN-DM 100-10 MG/5ML PO SYRP
10.0000 mL | ORAL_SOLUTION | ORAL | Status: DC | PRN
  Administered 2019-01-11: 10 mL via ORAL
  Filled 2019-01-08: qty 10

## 2019-01-08 MED ORDER — KETOCONAZOLE 2 % EX SHAM
1.0000 "application " | MEDICATED_SHAMPOO | CUTANEOUS | Status: DC
  Administered 2019-01-09 – 2019-01-13 (×2): 1 via TOPICAL
  Filled 2019-01-08: qty 120

## 2019-01-08 MED ORDER — TRAMADOL HCL 50 MG PO TABS
50.0000 mg | ORAL_TABLET | Freq: Four times a day (QID) | ORAL | Status: DC | PRN
  Administered 2019-01-09: 50 mg via ORAL
  Filled 2019-01-08: qty 1

## 2019-01-08 MED ORDER — OXYCODONE-ACETAMINOPHEN 5-325 MG PO TABS: 1.0000 | ORAL_TABLET | ORAL | Status: DC | PRN

## 2019-01-08 MED ORDER — SORBITOL 70 % SOLN: 30.0000 mL | Freq: Every day | Status: DC | PRN

## 2019-01-08 MED ORDER — LORATADINE 10 MG PO TABS
10.0000 mg | ORAL_TABLET | Freq: Every day | ORAL | Status: DC
  Administered 2019-01-08 – 2019-01-14 (×7): 10 mg via ORAL
  Filled 2019-01-08 (×7): qty 1

## 2019-01-08 MED ORDER — CLOBETASOL PROPIONATE 0.05 % EX SOLN: 1.0000 "application " | Freq: Every day | CUTANEOUS | Status: DC | PRN

## 2019-01-08 MED ORDER — FLUTICASONE FUROATE-VILANTEROL 100-25 MCG/INH IN AEPB: 1.0000 | INHALATION_SPRAY | Freq: Every day | RESPIRATORY_TRACT | Status: DC

## 2019-01-08 MED ORDER — VITAMIN D (ERGOCALCIFEROL) 1.25 MG (50000 UNIT) PO CAPS
50000.0000 [IU] | ORAL_CAPSULE | ORAL | Status: DC
  Administered 2019-01-08: 50000 [IU] via ORAL

## 2019-01-08 MED ORDER — DULOXETINE HCL 60 MG PO CPEP
60.0000 mg | ORAL_CAPSULE | Freq: Every day | ORAL | Status: DC
  Administered 2019-01-08 – 2019-01-14 (×7): 60 mg via ORAL
  Filled 2019-01-08 (×8): qty 1

## 2019-01-08 MED ORDER — FLUTICASONE FUROATE-VILANTEROL 200-25 MCG/INH IN AEPB
1.0000 | INHALATION_SPRAY | Freq: Every day | RESPIRATORY_TRACT | Status: DC
  Administered 2019-01-08 – 2019-01-14 (×7): 1 via RESPIRATORY_TRACT
  Filled 2019-01-08: qty 28

## 2019-01-08 MED ORDER — ENOXAPARIN SODIUM 60 MG/0.6ML ~~LOC~~ SOLN
55.0000 mg | SUBCUTANEOUS | Status: DC
  Administered 2019-01-08 – 2019-01-13 (×6): 55 mg via SUBCUTANEOUS
  Filled 2019-01-08 (×6): qty 0.6

## 2019-01-08 MED ORDER — ZINC SULFATE 220 (50 ZN) MG PO CAPS
220.0000 mg | ORAL_CAPSULE | Freq: Every day | ORAL | Status: DC
  Administered 2019-01-08 – 2019-01-14 (×7): 220 mg via ORAL
  Filled 2019-01-08 (×8): qty 1

## 2019-01-08 MED ORDER — MONTELUKAST SODIUM 10 MG PO TABS
10.0000 mg | ORAL_TABLET | Freq: Every day | ORAL | Status: DC
  Administered 2019-01-08 – 2019-01-13 (×6): 10 mg via ORAL
  Filled 2019-01-08 (×6): qty 1

## 2019-01-08 MED ORDER — HYDROCOD POLST-CPM POLST ER 10-8 MG/5ML PO SUER
5.0000 mL | Freq: Two times a day (BID) | ORAL | Status: DC | PRN
  Administered 2019-01-10 – 2019-01-13 (×5): 5 mL via ORAL
  Filled 2019-01-08 (×6): qty 5

## 2019-01-08 MED ORDER — INSULIN ASPART 100 UNIT/ML ~~LOC~~ SOLN
0.0000 [IU] | Freq: Every day | SUBCUTANEOUS | Status: DC
  Administered 2019-01-08: 2 [IU] via SUBCUTANEOUS

## 2019-01-08 MED ORDER — ALBUTEROL SULFATE HFA 108 (90 BASE) MCG/ACT IN AERS
1.0000 | INHALATION_SPRAY | Freq: Four times a day (QID) | RESPIRATORY_TRACT | Status: DC | PRN
  Filled 2019-01-08: qty 6.7

## 2019-01-08 MED ORDER — BUPROPION HCL ER (XL) 150 MG PO TB24: 150.0000 mg | ORAL_TABLET | Freq: Every day | ORAL | Status: DC

## 2019-01-08 MED ORDER — ONDANSETRON HCL 4 MG/2ML IJ SOLN: 4.0000 mg | Freq: Four times a day (QID) | INTRAMUSCULAR | Status: DC | PRN

## 2019-01-08 MED ORDER — VITAMIN D 25 MCG (1000 UNIT) PO TABS
5000.0000 [IU] | ORAL_TABLET | Freq: Every day | ORAL | Status: DC
  Administered 2019-01-08 – 2019-01-14 (×7): 5000 [IU] via ORAL
  Filled 2019-01-08 (×7): qty 5

## 2019-01-08 MED ORDER — DEXAMETHASONE SODIUM PHOSPHATE 10 MG/ML IJ SOLN
6.0000 mg | INTRAMUSCULAR | Status: DC
  Administered 2019-01-08 – 2019-01-13 (×6): 6 mg via INTRAVENOUS
  Filled 2019-01-08: qty 1
  Filled 2019-01-08: qty 0.6
  Filled 2019-01-08 (×5): qty 1

## 2019-01-08 MED ORDER — GABAPENTIN 300 MG PO CAPS
300.0000 mg | ORAL_CAPSULE | Freq: Every day | ORAL | Status: DC
  Administered 2019-01-08 – 2019-01-13 (×6): 300 mg via ORAL
  Filled 2019-01-08 (×6): qty 1

## 2019-01-08 NOTE — Progress Notes (Signed)
Her BMI 37. Will adjust her lovenox to 55mg  SQ qday (0.5mg /kg/day). Scr is pending.   Onnie Boer, PharmD, BCIDP, AAHIVP, CPP Infectious Disease Pharmacist 01/08/2019 1:18 PM

## 2019-01-08 NOTE — H&P (Signed)
History and Physical    Meredith Olson DXI:[id number] DOB: 1970-05-04 DOA: 01/08/2019  PCP: Lucianne Lei, MD Patient coming from: [facility] ED  Chief Complaint: fevers  HPI: 48 y.o. female with history of diet-controlled DM 2, asthma, and fibromyalgia who states she was in her usual state of health until early in the a.m. October 6.  She awoke from sleep with a fever and pain in her ears.  Later that morning she presented to a local urgent care and was reportedly diagnosed with "fluid behind her eardrums on both sides."  She was placed on Augmentin and sent home.  Later that same day she began to develop very high fevers in the 102 range and therefore reported to the ED.  At no time did she have shortness of breath but she did have the occasional cough.  In the ED at [facility] she was found to have an oxygen saturation of 88% on room air.  A chest x-ray was without evidence of an infiltrate.  She was accepted on transfer to [facility] for an observation stay by Dr. Onalee Hua.  Assessment/Plan  COVID pneumonia Given the presence of hypoxia the patient has been placed on Decadron -with the patient appearing relatively stable clinically and with no significant infiltrate noted on chest x-ray I have not yet started Remdesivir -we will monitor the patient overnight in the hospital -if she develops severe recurrent fevers or an infiltrate is appreciated tomorrow we will discuss initiation of her course of Remdesivir  DM 2 The patient is diet controlled at home -follow-up with sliding scale insulin here -consider use of linagliptin  Fibromyalgia Continue usual home regimen  Mild transaminitis Likely due to COVID itself -follow trend  Asthma The patient reports that her asthma is exquisitely sensitive to nonsteroidal anti-inflammatory drugs -there is no evidence of an acute flare at this time   DVT prophylaxis: Lovenox Code Status: Full Family Communication: None Disposition Plan: Observation  Consults called: None  Review of Systems: As per HPI otherwise 10 point review of systems negative.   Past Medical History:  Diagnosis Date  . Anxiety   . Asthma   . Depression   . Fibromyalgia   . GERD (gastroesophageal reflux disease)   . Headache    migraines  . Shortness of breath dyspnea    due to asthma    Past Surgical History:  Procedure Laterality Date  . BILATERAL SALPINGECTOMY Bilateral 07/28/2015   Procedure: BILATERAL SALPINGO-OOPHORECTOMY;  Surgeon: Lavina Hamman, MD;  Location: [facility];  Service: Gynecology;  Laterality: Bilateral;  . CHOLECYSTECTOMY    . CYSTOSCOPY N/A 07/28/2015   Procedure: CYSTOSCOPY;  Surgeon: Lavina Hamman, MD;  Location: [facility];  Service: Gynecology;  Laterality: N/A;  . TONSILLECTOMY    . VAGINAL HYSTERECTOMY N/A 07/28/2015   Procedure: HYSTERECTOMY VAGINAL;  Surgeon: Lavina Hamman, MD;  Location: [facility];  Service: Gynecology;  Laterality: N/A;  MD needs 1.5hrs OR time    Family History  Noncontributory to this admission  Social History   reports that she has never smoked. She has never used smokeless tobacco. She reports that she does not drink alcohol or use drugs.  Lives with her husband in [place].  Allergies Allergies  Allergen Reactions  . Latex Itching    Red rash  . Aspirin     Asthma flareup  . Ibuprofen     Asthma flare up  . Shellfish Allergy Hives    Prior to Admission medications   Medication Sig Start Date  End Date Taking? Authorizing Provider  albuterol (PROVENTIL HFA;VENTOLIN HFA) 108 (90 Base) MCG/ACT inhaler Inhale 1-2 puffs into the lungs every 6 (six) hours as needed for wheezing or shortness of breath.    [provider]  Artificial Tear Ointment (DRY EYES OP) Apply 2 drops to eye daily as needed (for allergies or dry eyes).    [provider]  budesonide-formoterol (SYMBICORT) 80-4.5 MCG/ACT inhaler Inhale 2 puffs into the lungs 2 (two) times daily.    [provider]   buPROPion (WELLBUTRIN XL) 150 MG 24 hr tablet Take 150 mg by mouth daily.  06/07/15   [provider]  clobetasol (TEMOVATE) 0.05 % external solution Apply 1 application topically daily as needed (for eczema).  06/17/15   [provider]  DULoxetine (CYMBALTA) 60 MG capsule Take 60 mg by mouth daily.  06/05/15   [provider]  ketoconazole (NIZORAL) 2 % shampoo Apply 1 application topically 2 (two) times a week.  06/17/15   [provider]  montelukast (SINGULAIR) 10 MG tablet Take 10 mg by mouth at bedtime.  06/05/15   [provider]  oxyCODONE-acetaminophen (PERCOCET/ROXICET) 5-325 MG tablet Take 1-2 tablets by mouth every 4 (four) hours as needed for severe pain (moderate to severe pain (when tolerating fluids)). 07/29/15   Meisinger, Todd, MD  Vitamin D, Ergocalciferol, (DRISDOL) 50000 units CAPS capsule Take 50,000 Units by mouth every 7 (seven) days. thursday 06/05/15   [provider]    Physical Exam: Vitals:   01/08/19 1148 01/08/19 1600  BP: 119/73 131/74  Pulse: 72   Resp: 18 (!) 21  Temp: 98 F (36.7 C) 98.6 F (37 C)  TempSrc: Oral Oral  SpO2:  93%  Weight: 110.9 kg   Height: 5\' 8"  (1.727 m)     Constitutional: NAD, calm, comfortable ENMT: Mucous membranes are moist. Posterior pharynx clear of any exudate or lesions.Normal dentition.  Neck: normal, supple, no masses, no thyromegaly Respiratory: clear to auscultation bilaterally, no wheezing, no crackles. Normal respiratory effort. No accessory muscle use.  Cardiovascular: Regular rate and rhythm, no murmurs / rubs / gallops. No extremity edema. 2+ pedal pulses. No carotid bruits.  Abdomen: no tenderness, no masses palpated.  Slightly overweight no hepatosplenomegaly. Bowel sounds positive.  Musculoskeletal: no clubbing / cyanosis. No joint deformity upper and lower extremities. Good ROM, no contractures. Normal muscle tone.  Neurologic: CN 2-12 grossly intact. Psychiatric:  Normal judgment and insight. Alert and oriented x 3. Normal mood.    Labs on Admission:   CBC: Recent Labs  Lab 01/08/19 1221  WBC 5.2  NEUTROABS 3.8  HGB 13.9  HCT 42.9  MCV 88.5  PLT 270*   Basic Metabolic Panel: Recent Labs  Lab 01/08/19 1221  NA 136  K 3.5  CL 101  CO2 25  GLUCOSE 172*  BUN 15  CREATININE 0.83  CALCIUM 8.7*   GFR: Estimated Creatinine Clearance: 105.9 mL/min (by C-G formula based on SCr of 0.83 mg/dL).   Liver Function Tests: Recent Labs  Lab 01/08/19 1221  AST 97*  ALT 95*  ALKPHOS 84  BILITOT 0.4  PROT 7.5  ALBUMIN 3.9   Anemia Panel: Recent Labs    01/08/19 [place]    Cherene Altes, MD [facility] Office  [phone] Pager - Text Page per Amion as per below:  On-Call/Text Page:      [email or web address]  If 7PM-7AM, please contact night-coverage [email or web address] 01/08/2019, 5:37 PM

## 2019-01-08 NOTE — Plan of Care (Signed)
Will initiate plan of care. 

## 2019-01-09 ENCOUNTER — Observation Stay (HOSPITAL_COMMUNITY): Payer: HRSA Program

## 2019-01-09 DIAGNOSIS — J9601 Acute respiratory failure with hypoxia: Secondary | ICD-10-CM

## 2019-01-09 DIAGNOSIS — J129 Viral pneumonia, unspecified: Secondary | ICD-10-CM | POA: Diagnosis not present

## 2019-01-09 DIAGNOSIS — Z6837 Body mass index (BMI) 37.0-37.9, adult: Secondary | ICD-10-CM | POA: Diagnosis not present

## 2019-01-09 DIAGNOSIS — Z886 Allergy status to analgesic agent status: Secondary | ICD-10-CM | POA: Diagnosis not present

## 2019-01-09 DIAGNOSIS — J45909 Unspecified asthma, uncomplicated: Secondary | ICD-10-CM | POA: Diagnosis present

## 2019-01-09 DIAGNOSIS — J1289 Other viral pneumonia: Secondary | ICD-10-CM | POA: Diagnosis present

## 2019-01-09 DIAGNOSIS — F329 Major depressive disorder, single episode, unspecified: Secondary | ICD-10-CM | POA: Diagnosis present

## 2019-01-09 DIAGNOSIS — U071 COVID-19: Principal | ICD-10-CM

## 2019-01-09 DIAGNOSIS — Z91040 Latex allergy status: Secondary | ICD-10-CM | POA: Diagnosis not present

## 2019-01-09 DIAGNOSIS — E119 Type 2 diabetes mellitus without complications: Secondary | ICD-10-CM | POA: Diagnosis present

## 2019-01-09 DIAGNOSIS — E669 Obesity, unspecified: Secondary | ICD-10-CM | POA: Diagnosis present

## 2019-01-09 DIAGNOSIS — M797 Fibromyalgia: Secondary | ICD-10-CM | POA: Diagnosis present

## 2019-01-09 DIAGNOSIS — J9691 Respiratory failure, unspecified with hypoxia: Secondary | ICD-10-CM

## 2019-01-09 LAB — GLUCOSE, CAPILLARY
Glucose-Capillary: 122 mg/dL — ABNORMAL HIGH (ref 70–99)
Glucose-Capillary: 110 mg/dL — ABNORMAL HIGH (ref 70–99)
Glucose-Capillary: 111 mg/dL — ABNORMAL HIGH (ref 70–99)
Glucose-Capillary: 156 mg/dL — ABNORMAL HIGH (ref 70–99)

## 2019-01-09 LAB — CBC WITH DIFFERENTIAL/PLATELET
Abs Immature Granulocytes: 0.03 10*3/uL (ref 0.00–0.07)
Basophils Absolute: 0 10*3/uL (ref 0.0–0.1)
Basophils Relative: 0 %
Eosinophils Absolute: 0 10*3/uL (ref 0.0–0.5)
Eosinophils Relative: 0 %
HCT: 44.1 % (ref 36.0–46.0)
Hemoglobin: 14.1 g/dL (ref 12.0–15.0)
Immature Granulocytes: 0 %
Lymphocytes Relative: 22 %
Lymphs Abs: 1.9 10*3/uL (ref 0.7–4.0)
MCH: 28.7 pg (ref 26.0–34.0)
MCHC: 32 g/dL (ref 30.0–36.0)
MCV: 89.8 fL (ref 80.0–100.0)
Monocytes Absolute: 0.9 10*3/uL (ref 0.1–1.0)
Monocytes Relative: 10 %
Neutro Abs: 5.9 10*3/uL (ref 1.7–7.7)
Neutrophils Relative %: 68 %
Platelets: 447 10*3/uL — ABNORMAL HIGH (ref 150–400)
RBC: 4.91 MIL/uL (ref 3.87–5.11)
RDW: 13.8 % (ref 11.5–15.5)
WBC: 8.7 10*3/uL (ref 4.0–10.5)
nRBC: 0 % (ref 0.0–0.2)

## 2019-01-09 LAB — HEMOGLOBIN A1C
Hgb A1c MFr Bld: 7.2 % — ABNORMAL HIGH (ref 4.8–5.6)
Mean Plasma Glucose: 159.94 mg/dL

## 2019-01-09 LAB — C-REACTIVE PROTEIN: CRP: 1.6 mg/dL — ABNORMAL HIGH (ref <1.0)

## 2019-01-09 LAB — COMPREHENSIVE METABOLIC PANEL
ALT: 79 U/L — ABNORMAL HIGH (ref 0–44)
AST: 63 U/L — ABNORMAL HIGH (ref 15–41)
Albumin: 3.8 g/dL (ref 3.5–5.0)
Alkaline Phosphatase: 84 U/L (ref 38–126)
Anion gap: 10 (ref 5–15)
BUN: 22 mg/dL — ABNORMAL HIGH (ref 6–20)
CO2: 28 mmol/L (ref 22–32)
Calcium: 8.8 mg/dL — ABNORMAL LOW (ref 8.9–10.3)
Chloride: 99 mmol/L (ref 98–111)
Creatinine, Ser: 0.93 mg/dL (ref 0.44–1.00)
GFR calc Af Amer: >60 mL/min (ref >60)
GFR calc non Af Amer: >60 mL/min (ref >60)
Glucose, Bld: 135 mg/dL — ABNORMAL HIGH (ref 70–99)
Potassium: 4 mmol/L (ref 3.5–5.1)
Sodium: 137 mmol/L (ref 135–145)
Total Bilirubin: 0.8 mg/dL (ref 0.3–1.2)
Total Protein: 7.4 g/dL (ref 6.5–8.1)

## 2019-01-09 LAB — D-DIMER, QUANTITATIVE: D-Dimer, Quant: 0.43 ug/mL-FEU (ref 0.00–0.50)

## 2019-01-09 LAB — FERRITIN: Ferritin: 148 ng/mL (ref 11–307)

## 2019-01-09 LAB — MAGNESIUM: Magnesium: 2.5 mg/dL — ABNORMAL HIGH (ref 1.7–2.4)

## 2019-01-09 MED ORDER — MAGNESIUM OXIDE 400 (241.3 MG) MG PO TABS
200.0000 mg | ORAL_TABLET | Freq: Every day | ORAL | Status: DC
  Administered 2019-01-09 – 2019-01-14 (×6): 200 mg via ORAL
  Filled 2019-01-09 (×7): qty 1

## 2019-01-09 MED ORDER — SODIUM CHLORIDE 0.9 % IV SOLN
100.0000 mg | INTRAVENOUS | Status: AC
  Administered 2019-01-10 – 2019-01-13 (×4): 100 mg via INTRAVENOUS
  Filled 2019-01-09 (×4): qty 20

## 2019-01-09 MED ORDER — SODIUM CHLORIDE 0.9 % IV SOLN
200.0000 mg | Freq: Once | INTRAVENOUS | Status: AC
  Administered 2019-01-09: 200 mg via INTRAVENOUS
  Filled 2019-01-09: qty 40

## 2019-01-09 NOTE — Progress Notes (Signed)
Patient did not want me to call her husband, said that she would update him when she talked to him later.

## 2019-01-09 NOTE — Progress Notes (Signed)
Pharmacy Note - Remdesivir Dosing  O: 48 yo female admitted with COVID-19. Patient is on 2L [place] with O2 saturation 92%. ALT of 79 is below cut off (<220). Chest x-ray did not indicate lower respiratory tract infection. Patient meets the criteria for remdesivir of admitted with COVID-19, requiring supplemental oxygen, and ALT <220. ALT: 79 CXR (10/8): negative Requiring supplemental O2: 2L [place] with O2 sat 92%   A/P:  Patient meets criteria for remdesivir.  Begin remdesivir 200 mg IV x 1, followed by 100 mg IV daily x 4 days  Monitor ALT, clinical progress  Cristela Felt, PharmD PGY1 Pharmacy Resident 01/09/2019 10:34 AM

## 2019-01-09 NOTE — Progress Notes (Signed)
PROGRESS NOTE    Meredith Olson  KDT:[id number] DOB: [birth year] DOA: 01/08/2019 PCP: Nicholos Johns, MD    Brief Narrative:  48 year old female who presented with fever.  She does have significant past medical history for type II that is mellitus, asthma and fibromyalgia.  October 6 she was seen in the emergency department for apparently an ear infection, and was placed on Augmentin.  Due to persistent fevers she returned to the emergency department where her oxygen saturation was found to be 88%.  Her chest radiograph had no infiltrates. Sodium 136, potassium 3.5, chloride 101, bicarb 25, glucose 172, BUN 15, creatinine 0.83, AST 92, ALT 95, procalcitonin less than 0.10, white count 5.2, hemoglobin 13.9, hematocrit 42.9, platelets 423.  Her chest radiograph negative for infiltrates.  Patient was admitted to the hospital with a working diagnosis of possible viral pneumonia, SARS COVID-19.  Assessment & Plan:   Principal Problem:   Respiratory failure with hypoxia (HCC) Active Problems:   Pneumonia due to COVID-19 virus   Viral pneumonia   Obesity (BMI 35.0-39.9 without comorbidity)   1. Acute hypoxic respiratory failure due to SARS COVID 19 viral pneumonia. Patient has remained with oxygen saturation less than <94%, currently 91 to 90% on 2 LPM supplemental 02 per [place]. No mayor dyspnea, silent hypoxemia. Her chest film with no defined infiltrates, but the pretest probability for viral pneumonia is high. Will start patient with Remdesivir and will continue with systemic steroids. Follow with inflammatory markers and continue oxymetry monitoring, keep oxygen saturation greater than 94%. Continue zinc and vitamin C.   2. T2DM. Fasting glucose this am is 135, will continue glucose cover and monitoring with insulin sliding scale, patient is tolerating po well. Patient at risk of hyperglycemia induced related to systemic steroids.   3. Asthma. No signs of acute exacerbation, continue close  monitoring. Continue with breo.   4. Elevated liver enzymes. AST and ALT are trending down to 63 and 79 respectively.   5. Obesity. Calculated BMI is 37.   6. Depression. Continue duloxetine.   DVT prophylaxis: enoxaparin   Code Status: full Family Communication: no family at the bedside  Disposition Plan/ discharge barriers: pending clinical improvement.   Body mass index is 37.17 kg/m. Malnutrition Type:      Malnutrition Characteristics:      Nutrition Interventions:     RN Pressure Injury Documentation:     Consultants:     Procedures:     Antimicrobials:   Remdesivir.     Subjective: Patient has remained hypoxic on room air, below 94%, positive cough no sore throat. No nausea or vomiting, no chest pain.   Objective: Vitals:   01/08/19 1925 01/09/19 0005 01/09/19 0420 01/09/19 0746  BP: 110/71 110/60 104/69 122/78  Pulse: 83 72 (!) 59 82  Resp: 15 (!) 21 15 16   Temp: 98.9 F (37.2 C) 98.5 F (36.9 C) 97.9 F (36.6 C) 99.1 F (37.3 C)  TempSrc: Oral Oral Oral Oral  SpO2: 91% 92% 90% 91%  Weight:      Height:       No intake or output data in the 24 hours ending 01/09/19 0841 Filed Weights   01/08/19 1148  Weight: 110.9 kg    Examination:   General: Not in pain or dyspnea, deconditioned  Neurology: Awake and alert, non focal  E DXI:PJAS pallor, no icterus, oral mucosa moist Cardiovascular: No JVD. S1-S2 present, rhythmic, no gallops, rubs, or murmurs. No lower extremity edema. Pulmonary: positive breath sounds bilaterally.  Gastrointestinal. Abdomen with no organomegaly, non tender, no rebound or guarding Skin. No rashes Musculoskeletal: no joint deformities     Data Reviewed: I have personally reviewed following labs and imaging studies  CBC: Recent Labs  Lab 01/08/19 1221 01/09/19 0430  WBC 5.2 8.7  NEUTROABS 3.8 5.9  HGB 13.9 14.1  HCT 42.9 44.1  MCV 88.5 89.8  PLT 423* 447*   Basic Metabolic Panel: Recent Labs   Lab 01/08/19 1221 01/09/19 0430  NA 136 137  K 3.5 4.0  CL 101 99  CO2 25 28  GLUCOSE 172* 135*  BUN 15 22*  CREATININE 0.83 0.93  CALCIUM 8.7* 8.8*  MG  --  2.5*   GFR: Estimated Creatinine Clearance: 94.5 mL/min (by C-G formula based on SCr of 0.93 mg/dL). Liver Function Tests: Recent Labs  Lab 01/08/19 1221 01/09/19 0430  AST 97* 63*  ALT 95* 79*  ALKPHOS 84 84  BILITOT 0.4 0.8  PROT 7.5 7.4  ALBUMIN 3.9 3.8   No results for input(s): LIPASE, AMYLASE in the last 168 hours. No results for input(s): AMMONIA in the last 168 hours. Coagulation Profile: No results for input(s): INR, PROTIME in the last 168 hours. Cardiac Enzymes: No results for input(s): CKTOTAL, CKMB, CKMBINDEX, TROPONINI in the last 168 hours. BNP (last 3 results) No results for input(s): PROBNP in the last 8760 hours. HbA1C: Recent Labs    01/09/19 0430  HGBA1C 7.2*   CBG: Recent Labs  Lab 01/08/19 2023 01/09/19 0746  GLUCAP 250* 122*   Lipid Profile: No results for input(s): CHOL, HDL, LDLCALC, TRIG, CHOLHDL, LDLDIRECT in the last 72 hours. Thyroid Function Tests: No results for input(s): TSH, T4TOTAL, FREET4, T3FREE, THYROIDAB in the last 72 hours. Anemia Panel: Recent Labs    01/08/19 1221 01/09/19 0430  FERRITIN 129 148      Radiology Studies: I have reviewed all of the imaging during this hospital visit personally     Scheduled Meds: . cholecalciferol  5,000 Units Oral Daily  . dexamethasone (DECADRON) injection  6 mg Intravenous Q24H  . DULoxetine  60 mg Oral Daily  . enoxaparin (LOVENOX) injection  55 mg Subcutaneous Q24H  . fluticasone furoate-vilanterol  1 puff Inhalation Daily  . gabapentin  300 mg Oral QHS  . insulin aspart  0-5 Units Subcutaneous QHS  . insulin aspart  0-9 Units Subcutaneous TID WC  . ketoconazole  1 application Topical 2 times weekly  . loratadine  10 mg Oral Daily  . montelukast  10 mg Oral QHS  . vitamin C  500 mg Oral Daily  . zinc  sulfate  220 mg Oral Daily   Continuous Infusions:   LOS: 0 days        Mauricio Annett Gula, MD

## 2019-01-09 NOTE — TOC Initial Note (Signed)
Transition of Care [facility]) - Initial/Assessment Note    Patient Details  Name: Meredith Olson MRN: [id number] Date of Birth: Jan 13, 1971  [facility] CM/SW Contact:    Ninfa Meeker, RN Phone Number: [phone] (working remotely) 01/09/2019, 4:08 PM  Clinical Narrative:    48 yr old female admitted from home with Magdalena 19. She is on oxygen at 2L [place], and Remdesivir. Patient is uninsured but has PCP. Case manager will follow for needs.                   Patient Goals and CMS Choice        Expected Discharge Plan and Services: to be determined                                                Prior Living Arrangements/Services                       Activities of Daily Living Home Assistive Devices/Equipment: CBG Meter ADL Screening (condition at time of admission) Patient's cognitive ability adequate to safely complete daily activities?: Yes Is the patient deaf or have difficulty hearing?: No Does the patient have difficulty seeing, even when wearing glasses/contacts?: No Does the patient have difficulty concentrating, remembering, or making decisions?: No Patient able to express need for assistance with ADLs?: Yes Does the patient have difficulty dressing or bathing?: No Independently performs ADLs?: Yes (appropriate for developmental age) Does the patient have difficulty walking or climbing stairs?: No Weakness of Legs: None Weakness of Arms/Hands: None  Permission Sought/Granted                  Emotional Assessment              Admission diagnosis:  COVID-19 [U07.1] Patient Active Problem List   Diagnosis Date Noted  . Viral pneumonia 01/09/2019  . Obesity (BMI 35.0-39.9 without comorbidity) 01/09/2019  . Respiratory failure with hypoxia ([place]) 01/09/2019  . Pneumonia due to COVID-19 virus 01/08/2019  . S/P vaginal hysterectomy 07/28/2015   PCP:  Nicholos Johns, MD Pharmacy:   [facility], [place] - [name] [place] [address] Phone: [phone] Fax: [phone]     Social Determinants of Health (SDOH) Interventions    Readmission Risk Interventions No flowsheet data found.

## 2019-01-10 LAB — COMPREHENSIVE METABOLIC PANEL
ALT: 62 U/L — ABNORMAL HIGH (ref 0–44)
AST: 47 U/L — ABNORMAL HIGH (ref 15–41)
Albumin: 3.8 g/dL (ref 3.5–5.0)
Alkaline Phosphatase: 75 U/L (ref 38–126)
Anion gap: 12 (ref 5–15)
BUN: 16 mg/dL (ref 6–20)
CO2: 28 mmol/L (ref 22–32)
Calcium: 8.5 mg/dL — ABNORMAL LOW (ref 8.9–10.3)
Chloride: 95 mmol/L — ABNORMAL LOW (ref 98–111)
Creatinine, Ser: 0.85 mg/dL (ref 0.44–1.00)
GFR calc Af Amer: >60 mL/min (ref >60)
GFR calc non Af Amer: >60 mL/min (ref >60)
Glucose, Bld: 102 mg/dL — ABNORMAL HIGH (ref 70–99)
Potassium: 3.5 mmol/L (ref 3.5–5.1)
Sodium: 135 mmol/L (ref 135–145)
Total Bilirubin: 0.3 mg/dL (ref 0.3–1.2)
Total Protein: 7 g/dL (ref 6.5–8.1)

## 2019-01-10 LAB — CBC WITH DIFFERENTIAL/PLATELET
Abs Immature Granulocytes: 0.03 10*3/uL (ref 0.00–0.07)
Basophils Absolute: 0 10*3/uL (ref 0.0–0.1)
Basophils Relative: 0 %
Eosinophils Absolute: 0 10*3/uL (ref 0.0–0.5)
Eosinophils Relative: 0 %
HCT: 42.8 % (ref 36.0–46.0)
Hemoglobin: 13.8 g/dL (ref 12.0–15.0)
Immature Granulocytes: 0 %
Lymphocytes Relative: 20 %
Lymphs Abs: 1.6 10*3/uL (ref 0.7–4.0)
MCH: 29.1 pg (ref 26.0–34.0)
MCHC: 32.2 g/dL (ref 30.0–36.0)
MCV: 90.3 fL (ref 80.0–100.0)
Monocytes Absolute: 0.6 10*3/uL (ref 0.1–1.0)
Monocytes Relative: 7 %
Neutro Abs: 6 10*3/uL (ref 1.7–7.7)
Neutrophils Relative %: 73 %
Platelets: 380 10*3/uL (ref 150–400)
RBC: 4.74 MIL/uL (ref 3.87–5.11)
RDW: 13.9 % (ref 11.5–15.5)
WBC: 8.2 10*3/uL (ref 4.0–10.5)
nRBC: 0 % (ref 0.0–0.2)

## 2019-01-10 LAB — GLUCOSE, CAPILLARY
Glucose-Capillary: 99 mg/dL (ref 70–99)
Glucose-Capillary: 97 mg/dL (ref 70–99)
Glucose-Capillary: 148 mg/dL — ABNORMAL HIGH (ref 70–99)
Glucose-Capillary: 168 mg/dL — ABNORMAL HIGH (ref 70–99)

## 2019-01-10 LAB — FERRITIN: Ferritin: 153 ng/mL (ref 11–307)

## 2019-01-10 LAB — C-REACTIVE PROTEIN: CRP: 5.7 mg/dL — ABNORMAL HIGH (ref <1.0)

## 2019-01-10 LAB — D-DIMER, QUANTITATIVE: D-Dimer, Quant: 0.31 ug/mL-FEU (ref 0.00–0.50)

## 2019-01-10 LAB — MAGNESIUM: Magnesium: 2.2 mg/dL (ref 1.7–2.4)

## 2019-01-10 MED ORDER — ALBUTEROL SULFATE HFA 108 (90 BASE) MCG/ACT IN AERS
2.0000 | INHALATION_SPRAY | Freq: Two times a day (BID) | RESPIRATORY_TRACT | Status: DC
  Administered 2019-01-10 – 2019-01-14 (×9): 2 via RESPIRATORY_TRACT

## 2019-01-10 MED ORDER — IBUPROFEN 400 MG PO TABS
400.0000 mg | ORAL_TABLET | ORAL | Status: DC | PRN
  Filled 2019-01-10: qty 1

## 2019-01-10 MED ORDER — KETOROLAC TROMETHAMINE 15 MG/ML IJ SOLN
15.0000 mg | Freq: Once | INTRAMUSCULAR | Status: AC
  Administered 2019-01-10: 15 mg via INTRAVENOUS
  Filled 2019-01-10: qty 1

## 2019-01-10 MED ORDER — FAMOTIDINE 20 MG PO TABS
20.0000 mg | ORAL_TABLET | Freq: Every day | ORAL | Status: DC
  Administered 2019-01-10 – 2019-01-14 (×5): 20 mg via ORAL
  Filled 2019-01-10 (×6): qty 1

## 2019-01-10 NOTE — Progress Notes (Addendum)
PROGRESS NOTE    Meredith Olson  QPY:[id number] DOB: 09/10/1970 DOA: 01/08/2019 PCP: Nicholos Johns, MD    Brief Narrative:  48 year old female who presented with fever.  She does have significant past medical history for type II diabetes mellitus, asthma and fibromyalgia.  October 6 she was seen in the emergency department for apparently an ear infection, and was placed on Augmentin.  Due to persistent fevers she returned to the emergency department where her oxygen saturation was found to be 88%.  Sodium 136, potassium 3.5, chloride 101, bicarb 25, glucose 172, BUN 15, creatinine 0.83, AST 92, ALT 95, procalcitonin less than 0.10, white count 5.2, hemoglobin 13.9, hematocrit 42.9, platelets 423.  Her chest radiograph negative for infiltrates.  Patient was admitted to the hospital with a working diagnosis of suspected viral pneumonia, SARS COVID-19.  Her pretest probability for viral pneumonia is very high. Patient started on Remdesivir and systemic steroids  Patient continue to have increased oxygen requirements, now up to 2,5 LPM per [place] to maintain oxygen saturation greater than 88%.    Assessment & Plan:   Principal Problem:   Respiratory failure with hypoxia (HCC) Active Problems:   Pneumonia due to COVID-19 virus   Viral pneumonia   Obesity (BMI 35.0-39.9 without comorbidity)    1. Acute hypoxic respiratory failure due to SARS COVID 19 viral pneumonia. Increased oxygen requirements to 2.5 LPM with oxygenation 92%. Today patient looks more deconditioned and dyspneic, positive cough.  Ferritin: 124<580<998 CRP: 4.1>1.6<5.7 D dimer  0.60>0.43>0.31  Will continue medical therapy with Remdesivir and systemic steroids with dexamethasone. Continue oxymetry monitoring, vitamin C. Zinc and prone positing as tolerated. Will check chest film in am.   2. T2DM. Fasting glucose this am is 102. Capillary glucose 110, 111, 156, 99, 97. Continue glucose cover and monitoring with insulin  sliding scale. Hold on basal insulin for now.  3. Asthma. No clinical signs of acute exacerbation. Continue with breo and albuterol as needed and scheduled bid.   4. Elevated liver enzymes. Continue to improve liver profile with AST down to 47 and ALT down to 62.   5. Obesity.  Her calculated BMI is 37.   6. Depression. On duloxetine.   DVT prophylaxis: enoxaparin   Code Status: full Family Communication: no family at the bedside  Disposition Plan/ discharge barriers: pending clinical improvement.     Body mass index is 37.17 kg/m. Malnutrition Type:      Malnutrition Characteristics:      Nutrition Interventions:     RN Pressure Injury Documentation:     Consultants:     Procedures:     Antimicrobials:   Remdesivir.     Subjective: Patient today deconditioned, positive malaise and cough, fever last night, poor appetite, no nausea or vomiting. Mild dyspnea.   Objective: Vitals:   01/10/19 0300 01/10/19 0447 01/10/19 0742 01/10/19 0834  BP:   103/66   Pulse:   (!) 105   Resp:   20   Temp:  99.3 F (37.4 C) (!) 102.4 F (39.1 C) 100 F (37.8 C)  TempSrc:  Oral Oral Oral  SpO2: 91%  92%   Weight:      Height:        Intake/Output Summary (Last 24 hours) at 01/10/2019 0837 Last data filed at 01/09/2019 1849 Gross per 24 hour  Intake 1450 ml  Output -  Net 1450 ml   Filed Weights   01/08/19 1148  Weight: 110.9 kg    Examination:  General: deconditioned and ill looking appearing.  Neurology: Awake and alert, non focal  E ENT: mild pallor, no icterus, oral mucosa dry.  Cardiovascular: No JVD. S1-S2 present, rhythmic, no gallops, rubs, or murmurs. No lower extremity edema. Pulmonary: positive breath sounds bilaterally. Gastrointestinal. Abdomen with no organomegaly, non tender, no rebound or guarding Skin. No rashes Musculoskeletal: no joint deformities     Data Reviewed: I have personally reviewed following labs and imaging  studies  CBC: Recent Labs  Lab 01/08/19 1221 01/09/19 0430 01/10/19 0340  WBC 5.2 8.7 8.2  NEUTROABS 3.8 5.9 6.0  HGB 13.9 14.1 13.8  HCT 42.9 44.1 42.8  MCV 88.5 89.8 90.3  PLT 423* 447* 380   Basic Metabolic Panel: Recent Labs  Lab 01/08/19 1221 01/09/19 0430 01/10/19 0340  NA 136 137 135  K 3.5 4.0 3.5  CL 101 99 95*  CO2 25 28 28   GLUCOSE 172* 135* 102*  BUN 15 22* 16  CREATININE 0.83 0.93 0.85  CALCIUM 8.7* 8.8* 8.5*  MG  --  2.5* 2.2   GFR: Estimated Creatinine Clearance: 103.4 mL/min (by C-G formula based on SCr of 0.85 mg/dL). Liver Function Tests: Recent Labs  Lab 01/08/19 1221 01/09/19 0430 01/10/19 0340  AST 97* 63* 47*  ALT 95* 79* 62*  ALKPHOS 84 84 75  BILITOT 0.4 0.8 0.3  PROT 7.5 7.4 7.0  ALBUMIN 3.9 3.8 3.8   No results for input(s): LIPASE, AMYLASE in the last 168 hours. No results for input(s): AMMONIA in the last 168 hours. Coagulation Profile: No results for input(s): INR, PROTIME in the last 168 hours. Cardiac Enzymes: No results for input(s): CKTOTAL, CKMB, CKMBINDEX, TROPONINI in the last 168 hours. BNP (last 3 results) No results for input(s): PROBNP in the last 8760 hours. HbA1C: Recent Labs    01/09/19 0430  HGBA1C 7.2*   CBG: Recent Labs  Lab 01/09/19 0746 01/09/19 1156 01/09/19 1619 01/09/19 2202 01/10/19 0728  GLUCAP 122* 110* 111* 156* 99   Lipid Profile: No results for input(s): CHOL, HDL, LDLCALC, TRIG, CHOLHDL, LDLDIRECT in the last 72 hours. Thyroid Function Tests: No results for input(s): TSH, T4TOTAL, FREET4, T3FREE, THYROIDAB in the last 72 hours. Anemia Panel: Recent Labs    01/09/19 0430 01/10/19 0340  FERRITIN 148 153      Radiology Studies: I have reviewed all of the imaging during this hospital visit personally     Scheduled Meds: . cholecalciferol  5,000 Units Oral Daily  . dexamethasone (DECADRON) injection  6 mg Intravenous Q24H  . DULoxetine  60 mg Oral Daily  . enoxaparin  (LOVENOX) injection  55 mg Subcutaneous Q24H  . fluticasone furoate-vilanterol  1 puff Inhalation Daily  . gabapentin  300 mg Oral QHS  . insulin aspart  0-9 Units Subcutaneous TID WC  . ketoconazole  1 application Topical 2 times weekly  . loratadine  10 mg Oral Daily  . magnesium oxide  200 mg Oral Daily  . montelukast  10 mg Oral QHS  . vitamin C  500 mg Oral Daily  . zinc sulfate  220 mg Oral Daily   Continuous Infusions: . remdesivir 100 mg in NS 250 mL       LOS: 1 day        Mauricio 03/12/19, MD

## 2019-01-10 NOTE — Progress Notes (Signed)
husband updated.

## 2019-01-11 ENCOUNTER — Inpatient Hospital Stay (HOSPITAL_COMMUNITY): Payer: HRSA Program

## 2019-01-11 LAB — CBC WITH DIFFERENTIAL/PLATELET
Abs Immature Granulocytes: 0.01 10*3/uL (ref 0.00–0.07)
Basophils Absolute: 0 10*3/uL (ref 0.0–0.1)
Basophils Relative: 0 %
Eosinophils Absolute: 0 10*3/uL (ref 0.0–0.5)
Eosinophils Relative: 0 %
HCT: 43.2 % (ref 36.0–46.0)
Hemoglobin: 13.6 g/dL (ref 12.0–15.0)
Immature Granulocytes: 0 %
Lymphocytes Relative: 31 %
Lymphs Abs: 1.3 10*3/uL (ref 0.7–4.0)
MCH: 28.4 pg (ref 26.0–34.0)
MCHC: 31.5 g/dL (ref 30.0–36.0)
MCV: 90.2 fL (ref 80.0–100.0)
Monocytes Absolute: 0.5 10*3/uL (ref 0.1–1.0)
Monocytes Relative: 11 %
Neutro Abs: 2.4 10*3/uL (ref 1.7–7.7)
Neutrophils Relative %: 58 %
Platelets: 346 10*3/uL (ref 150–400)
RBC: 4.79 MIL/uL (ref 3.87–5.11)
RDW: 13.8 % (ref 11.5–15.5)
WBC: 4.2 10*3/uL (ref 4.0–10.5)
nRBC: 0 % (ref 0.0–0.2)

## 2019-01-11 LAB — MAGNESIUM: Magnesium: 2.5 mg/dL — ABNORMAL HIGH (ref 1.7–2.4)

## 2019-01-11 LAB — COMPREHENSIVE METABOLIC PANEL
ALT: 69 U/L — ABNORMAL HIGH (ref 0–44)
AST: 66 U/L — ABNORMAL HIGH (ref 15–41)
Albumin: 3.4 g/dL — ABNORMAL LOW (ref 3.5–5.0)
Alkaline Phosphatase: 70 U/L (ref 38–126)
Anion gap: 12 (ref 5–15)
BUN: 17 mg/dL (ref 6–20)
CO2: 27 mmol/L (ref 22–32)
Calcium: 8.4 mg/dL — ABNORMAL LOW (ref 8.9–10.3)
Chloride: 98 mmol/L (ref 98–111)
Creatinine, Ser: 0.84 mg/dL (ref 0.44–1.00)
GFR calc Af Amer: >60 mL/min (ref >60)
GFR calc non Af Amer: >60 mL/min (ref >60)
Glucose, Bld: 144 mg/dL — ABNORMAL HIGH (ref 70–99)
Potassium: 4.2 mmol/L (ref 3.5–5.1)
Sodium: 137 mmol/L (ref 135–145)
Total Bilirubin: 0.5 mg/dL (ref 0.3–1.2)
Total Protein: 6.8 g/dL (ref 6.5–8.1)

## 2019-01-11 LAB — FERRITIN: Ferritin: 233 ng/mL (ref 11–307)

## 2019-01-11 LAB — GLUCOSE, CAPILLARY
Glucose-Capillary: 109 mg/dL — ABNORMAL HIGH (ref 70–99)
Glucose-Capillary: 129 mg/dL — ABNORMAL HIGH (ref 70–99)
Glucose-Capillary: 161 mg/dL — ABNORMAL HIGH (ref 70–99)
Glucose-Capillary: 173 mg/dL — ABNORMAL HIGH (ref 70–99)

## 2019-01-11 LAB — D-DIMER, QUANTITATIVE: D-Dimer, Quant: <0.27 ug/mL-FEU (ref 0.00–0.50)

## 2019-01-11 LAB — C-REACTIVE PROTEIN: CRP: 4.4 mg/dL — ABNORMAL HIGH (ref <1.0)

## 2019-01-11 MED ORDER — KETOROLAC TROMETHAMINE 15 MG/ML IJ SOLN
15.0000 mg | Freq: Four times a day (QID) | INTRAMUSCULAR | Status: DC | PRN
  Filled 2019-01-11: qty 1

## 2019-01-11 MED ORDER — GUAIFENESIN-DM 100-10 MG/5ML PO SYRP
10.0000 mL | ORAL_SOLUTION | ORAL | Status: DC
  Administered 2019-01-11 – 2019-01-14 (×15): 10 mL via ORAL
  Filled 2019-01-11 (×15): qty 10

## 2019-01-11 NOTE — Progress Notes (Signed)
1915 Report received and care assumed from [facility]. Pt resting in bed with call bell in reach. NADN no c/o or needs identified. Will continue to monitor

## 2019-01-11 NOTE — Progress Notes (Signed)
PROGRESS NOTE    Meredith Olson  ACZ:[id number] DOB: May 11, 1970 DOA: 01/08/2019 PCP: Nicholos Johns, MD    Brief Narrative:  48 year old female who presented with fever. She does have significant past medical history for type II diabetes mellitus, asthma and fibromyalgia. October 6 she was seen in the emergency department for apparently an ear infection, and was placed on Augmentin. Due to persistent fevers she returned to the emergency department where her oxygen saturation was found to be 88%.  Sodium 136, potassium 3.5, chloride 101, bicarb 25, glucose 172, BUN 15, creatinine 0.83, AST 92, ALT 95, procalcitonin less than 0.10, white count 5.2, hemoglobin 13.9, hematocrit 42.9, platelets 423. Her chest radiograph negative for infiltrates.  Patient was admitted to the hospital with a working diagnosis of suspected viral pneumonia,SARS COVID-19.  Her pretest probability for viral pneumonia is very high. Patient started on Remdesivir and systemic steroids  Patient continue to have increased oxygen requirements, now up to 2,5 LPM per Meredith Olson to maintain oxygen saturation greater than 88%.   Over the course of her hospitalization patient has become more symptomatic with fever, dyspnea and increased oxygen requirements. Chest film with new bilateral interstitial infiltrates bases and right upper lobe.   Assessment & Plan:   Principal Problem:   Respiratory failure with hypoxia (HCC) Active Problems:   Pneumonia due to COVID-19 virus   Viral pneumonia   Obesity (BMI 35.0-39.9 without comorbidity)    1. Acute hypoxic respiratory failure due to SARS COVID 19 viral pneumonia. Continue to increased oxygen requirements today at 4 LPM with oxygenation 93%. Positive fever, cough and dyspnea.  Ferritin: 129<148<153<233 CRP: 4.1>1.6<5.7>4.4 D dimer  0.60>0.43>0.31>0.27.   On Remdesivir with good toleration ( AST 66, ALT 69). Systemic steroids with dexamethasone (6 mg daily). On vitamin C  and Zinc. Prone positioning as tolerated. Fever control with acetaminophen and ketorolac. Guaifenesin with dextromethorphan for cough control.   2. T2DM.Fasting glucose this am is 144. On glucose cover and monitoring with insulin sliding scale. Poor appetite, but no nausea or vomiting.  3. Asthma. With no signs of acute exacerbation. On breo and albuterol.   4. Elevated liver enzymes. Stable elevation of AST and ALT, 66 and 69. Will continue close monitoring, continue antiviral therapy with Remdesivir.   5. Obesity.   BMI is 37.   6. Depression. Continue with duloxetine.  DVT prophylaxis:enoxaparin Code Status:full Family Communication:no family at the bedside Disposition Plan/ discharge barriers:pending clinical improvement    Body mass index is 37.17 kg/m. Malnutrition Type:      Malnutrition Characteristics:      Nutrition Interventions:     RN Pressure Injury Documentation:     Consultants:     Procedures:     Antimicrobials:   Remdesivir.     Subjective: Patient continue to feel ill, positive cough and dyspnea, her oxygen requirements have increased. Poor appetite and low energy.   Objective: Vitals:   01/10/19 2032 01/11/19 0346 01/11/19 0600 01/11/19 0715  BP:   97/60 (!) 100/52  Pulse:    68  Resp:      Temp: 99 F (37.2 C) 98.8 F (37.1 C)  99.2 F (37.3 C)  TempSrc: Oral Oral  Oral  SpO2: 90%   93%  Weight:      Height:        Intake/Output Summary (Last 24 hours) at 01/11/2019 0744 Last data filed at 01/10/2019 2200 Gross per 24 hour  Intake 1700 ml  Output -  Net 1700 ml  Filed Weights   01/08/19 1148  Weight: 110.9 kg    Examination:   General: Not in pain or dyspnea , deconditioned  Neurology: Awake and alert, non focal  E ENT: no pallor, no icterus, oral mucosa moist Cardiovascular: No JVD. S1-S2 present, rhythmic, no gallops, rubs, or murmurs. No lower extremity edema. Pulmonary: positive breath  sounds bilaterally. Gastrointestinal. Abdomen with no organomegaly, non tender, no rebound or guarding Skin. No rashes Musculoskeletal: no joint deformities     Data Reviewed: I have personally reviewed following labs and imaging studies  CBC: Recent Labs  Lab 01/08/19 1221 01/09/19 0430 01/10/19 0340 01/11/19 0149  WBC 5.2 8.7 8.2 4.2  NEUTROABS 3.8 5.9 6.0 2.4  HGB 13.9 14.1 13.8 13.6  HCT 42.9 44.1 42.8 43.2  MCV 88.5 89.8 90.3 90.2  PLT 423* 447* 380 346   Basic Metabolic Panel: Recent Labs  Lab 01/08/19 1221 01/09/19 0430 01/10/19 0340 01/11/19 0149  NA 136 137 135 137  K 3.5 4.0 3.5 4.2  CL 101 99 95* 98  CO2 25 28 28 27   GLUCOSE 172* 135* 102* 144*  BUN 15 22* 16 17  CREATININE 0.83 0.93 0.85 0.84  CALCIUM 8.7* 8.8* 8.5* 8.4*  MG  --  2.5* 2.2 2.5*   GFR: Estimated Creatinine Clearance: 104.6 mL/min (by C-G formula based on SCr of 0.84 mg/dL). Liver Function Tests: Recent Labs  Lab 01/08/19 1221 01/09/19 0430 01/10/19 0340 01/11/19 0149  AST 97* 63* 47* 66*  ALT 95* 79* 62* 69*  ALKPHOS 84 84 75 70  BILITOT 0.4 0.8 0.3 0.5  PROT 7.5 7.4 7.0 6.8  ALBUMIN 3.9 3.8 3.8 3.4*   No results for input(s): LIPASE, AMYLASE in the last 168 hours. No results for input(s): AMMONIA in the last 168 hours. Coagulation Profile: No results for input(s): INR, PROTIME in the last 168 hours. Cardiac Enzymes: No results for input(s): CKTOTAL, CKMB, CKMBINDEX, TROPONINI in the last 168 hours. BNP (last 3 results) No results for input(s): PROBNP in the last 8760 hours. HbA1C: Recent Labs    01/09/19 0430  HGBA1C 7.2*   CBG: Recent Labs  Lab 01/10/19 0728 01/10/19 1141 01/10/19 1647 01/10/19 2123 01/11/19 0719  GLUCAP 99 97 148* 168* 109*   Lipid Profile: No results for input(s): CHOL, HDL, LDLCALC, TRIG, CHOLHDL, LDLDIRECT in the last 72 hours. Thyroid Function Tests: No results for input(s): TSH, T4TOTAL, FREET4, T3FREE, THYROIDAB in the last 72  hours. Anemia Panel: Recent Labs    01/10/19 0340 01/11/19 0149  FERRITIN 153 233      Radiology Studies: I have reviewed all of the imaging during this hospital visit personally     Scheduled Meds: . albuterol  2 puff Inhalation BID  . cholecalciferol  5,000 Units Oral Daily  . dexamethasone (DECADRON) injection  6 mg Intravenous Q24H  . DULoxetine  60 mg Oral Daily  . enoxaparin (LOVENOX) injection  55 mg Subcutaneous Q24H  . famotidine  20 mg Oral Daily  . fluticasone furoate-vilanterol  1 puff Inhalation Daily  . gabapentin  300 mg Oral QHS  . insulin aspart  0-9 Units Subcutaneous TID WC  . ketoconazole  1 application Topical 2 times weekly  . loratadine  10 mg Oral Daily  . magnesium oxide  200 mg Oral Daily  . montelukast  10 mg Oral QHS  . vitamin C  500 mg Oral Daily  . zinc sulfate  220 mg Oral Daily   Continuous Infusions: . remdesivir  100 mg in NS 250 mL Stopped (01/10/19 1024)     LOS: 2 days        Yassen Kinnett Annett Gulaaniel Gari Trovato, MD

## 2019-01-12 LAB — COMPREHENSIVE METABOLIC PANEL
ALT: 59 U/L — ABNORMAL HIGH (ref 0–44)
AST: 50 U/L — ABNORMAL HIGH (ref 15–41)
Albumin: 3.2 g/dL — ABNORMAL LOW (ref 3.5–5.0)
Alkaline Phosphatase: 67 U/L (ref 38–126)
Anion gap: 10 (ref 5–15)
BUN: 15 mg/dL (ref 6–20)
CO2: 27 mmol/L (ref 22–32)
Calcium: 8.3 mg/dL — ABNORMAL LOW (ref 8.9–10.3)
Chloride: 100 mmol/L (ref 98–111)
Creatinine, Ser: 0.8 mg/dL (ref 0.44–1.00)
GFR calc Af Amer: >60 mL/min (ref >60)
GFR calc non Af Amer: >60 mL/min (ref >60)
Glucose, Bld: 148 mg/dL — ABNORMAL HIGH (ref 70–99)
Potassium: 4.2 mmol/L (ref 3.5–5.1)
Sodium: 137 mmol/L (ref 135–145)
Total Bilirubin: 0.3 mg/dL (ref 0.3–1.2)
Total Protein: 6.6 g/dL (ref 6.5–8.1)

## 2019-01-12 LAB — CBC WITH DIFFERENTIAL/PLATELET
Abs Immature Granulocytes: 0.01 10*3/uL (ref 0.00–0.07)
Basophils Absolute: 0 10*3/uL (ref 0.0–0.1)
Basophils Relative: 0 %
Eosinophils Absolute: 0 10*3/uL (ref 0.0–0.5)
Eosinophils Relative: 0 %
HCT: 42.8 % (ref 36.0–46.0)
Hemoglobin: 13.6 g/dL (ref 12.0–15.0)
Immature Granulocytes: 0 %
Lymphocytes Relative: 38 %
Lymphs Abs: 1.6 10*3/uL (ref 0.7–4.0)
MCH: 28.4 pg (ref 26.0–34.0)
MCHC: 31.8 g/dL (ref 30.0–36.0)
MCV: 89.4 fL (ref 80.0–100.0)
Monocytes Absolute: 0.4 10*3/uL (ref 0.1–1.0)
Monocytes Relative: 9 %
Neutro Abs: 2.1 10*3/uL (ref 1.7–7.7)
Neutrophils Relative %: 53 %
Platelets: 371 10*3/uL (ref 150–400)
RBC: 4.79 MIL/uL (ref 3.87–5.11)
RDW: 13.6 % (ref 11.5–15.5)
WBC: 4.1 10*3/uL (ref 4.0–10.5)
nRBC: 0 % (ref 0.0–0.2)

## 2019-01-12 LAB — MAGNESIUM: Magnesium: 2.3 mg/dL (ref 1.7–2.4)

## 2019-01-12 LAB — C-REACTIVE PROTEIN: CRP: 1.8 mg/dL — ABNORMAL HIGH (ref <1.0)

## 2019-01-12 LAB — GLUCOSE, CAPILLARY
Glucose-Capillary: 110 mg/dL — ABNORMAL HIGH (ref 70–99)
Glucose-Capillary: 134 mg/dL — ABNORMAL HIGH (ref 70–99)
Glucose-Capillary: 150 mg/dL — ABNORMAL HIGH (ref 70–99)
Glucose-Capillary: 180 mg/dL — ABNORMAL HIGH (ref 70–99)

## 2019-01-12 LAB — D-DIMER, QUANTITATIVE: D-Dimer, Quant: 0.29 ug/mL-FEU (ref 0.00–0.50)

## 2019-01-12 NOTE — Progress Notes (Signed)
PROGRESS NOTE    Meredith Olson  FIE:[id number] DOB: 08/09/1970 DOA: 01/08/2019 PCP: Nicholos Johns, MD    Brief Narrative:  48 year old female who presented with fever. She does have significant past medical history for type IIdiabetesmellitus, asthma and fibromyalgia. October 6 she was seen in the emergency department for apparently an ear infection, and was placed on Augmentin. Due to persistent fevers she returned to the emergency department where her oxygen saturation was found to be 88%.  Sodium 136, potassium 3.5, chloride 101, bicarb 25, glucose 172, BUN 15, creatinine 0.83, AST 92, ALT 95, procalcitonin less than 0.10, white count 5.2, hemoglobin 13.9, hematocrit 42.9, platelets 423. Her chest radiograph negative for infiltrates.  Patient was admitted to the hospital with a working diagnosis ofsuspectedviral pneumonia,SARS COVID-19.  Herpretest probability for viral pneumonia is veryhigh.Patientstarted onRemdesivir andsystemic steroids  Patient continue to have increased oxygen requirements, now up to 2,5 LPM per NCto maintain oxygen saturation greater than 88%.  Over the course of her hospitalization patient has become more symptomatic with fever, dyspnea and increased oxygen requirements. Chest film with new bilateral interstitial infiltrates bases and right upper lobe.   Continue to require 4 LPM per [place].     Assessment & Plan:   Principal Problem:   Respiratory failure with hypoxia (HCC) Active Problems:   Pneumonia due to COVID-19 virus   Viral pneumonia   Obesity (BMI 35.0-39.9 without comorbidity)    1. Acute hypoxic respiratory failure due to SARS COVID 19 viral pneumonia.Patient continue to dyspneic, her oxygen requirements  are stabel at 4 LPM. Fever has been controlled with IV ketorolac.   Fi02: 36%, 4 LPM per [place] Oxygen saturation:88 to 89%  Ferritin: 129<148<153<233 CRP: 4.1>1.6<5.7>4.4>1.8 D dimer 0.60>0.43>0.31>0.27<0.29   Continue medical therapy with Remdesivir # 4/5. Improved liver enzymes to AST 50 , ALT 59. Continue with ystemic steroids with dexamethasone, vitamin C and Zinc. Continue to encourage prone positioning as tolerated. Continue with Guaifenesin with dextromethorphan for cough control.   2. T2DM.Fasting glucose this am is 148. Capillary glucose 161, 173, 110, 134. Continue glucose cover and monitoring with insulin sliding scale.  3. Asthma.  No acute exacerbation. Continue with breo and albuterol.   4. Elevated liver enzymes.Improved profile with enzymes trending down, will continue with Remdesivir.   5. Obesity. Calculated BMI is 37.   6. Depression.On duloxetine.  DVT prophylaxis:enoxaparin Code Status:full Family Communication:no family at the bedside Disposition Plan/ discharge barriers:pending clinical improvement. Completion of remdesivir.    Body mass index is 37.17 kg/m. Malnutrition Type:      Malnutrition Characteristics:      Nutrition Interventions:     RN Pressure Injury Documentation:     Consultants:     Procedures:     Antimicrobials:   Remdesivir.     Subjective: Patient with generalized malaise, poor oral intake, continue to have dyspnea and cough, no nausea or vomiting. Has been on prone position during the day.   Objective: Vitals:   01/11/19 1557 01/11/19 1948 01/12/19 0400 01/12/19 0832  BP: 99/61 107/70 102/71 108/66  Pulse: 73  62 69  Resp: 15 18  (!) 22  Temp: 99.7 F (37.6 C) 98.7 F (37.1 C) 98.3 F (36.8 C) 98.8 F (37.1 C)  TempSrc: Oral Oral Oral Oral  SpO2: 91%  (!) 88% (!) 89%  Weight:      Height:        Intake/Output Summary (Last 24 hours) at 01/12/2019 0841 Last data filed at 01/12/2019 0200 Gross per  24 hour  Intake 1530 ml  Output -  Net 1530 ml   Filed Weights   01/08/19 1148  Weight: 110.9 kg    Examination:   General: Not in pain or dyspnea, deconditioned  Neurology: Awake  and alert, non focal  E ENT: mild pallor, no icterus, oral mucosa moist Cardiovascular: No JVD. S1-S2 present, rhythmic, no gallops, rubs, or murmurs. No lower extremity edema. Pulmonary:  Positive breath sounds bilaterally. Gastrointestinal. Abdomen with no organomegaly, non tender, no rebound or guarding Skin. No rashes Musculoskeletal: no joint deformities     Data Reviewed: I have personally reviewed following labs and imaging studies  CBC: Recent Labs  Lab 01/08/19 1221 01/09/19 0430 01/10/19 0340 01/11/19 0149  WBC 5.2 8.7 8.2 4.2  NEUTROABS 3.8 5.9 6.0 2.4  HGB 13.9 14.1 13.8 13.6  HCT 42.9 44.1 42.8 43.2  MCV 88.5 89.8 90.3 90.2  PLT 423* 447* 380 346   Basic Metabolic Panel: Recent Labs  Lab 01/08/19 1221 01/09/19 0430 01/10/19 0340 01/11/19 0149  NA 136 137 135 137  K 3.5 4.0 3.5 4.2  CL 101 99 95* 98  CO2 25 28 28 27   GLUCOSE 172* 135* 102* 144*  BUN 15 22* 16 17  CREATININE 0.83 0.93 0.85 0.84  CALCIUM 8.7* 8.8* 8.5* 8.4*  MG  --  2.5* 2.2 2.5*   GFR: Estimated Creatinine Clearance: 104.6 mL/min (by C-G formula based on SCr of 0.84 mg/dL). Liver Function Tests: Recent Labs  Lab 01/08/19 1221 01/09/19 0430 01/10/19 0340 01/11/19 0149  AST 97* 63* 47* 66*  ALT 95* 79* 62* 69*  ALKPHOS 84 84 75 70  BILITOT 0.4 0.8 0.3 0.5  PROT 7.5 7.4 7.0 6.8  ALBUMIN 3.9 3.8 3.8 3.4*   No results for input(s): LIPASE, AMYLASE in the last 168 hours. No results for input(s): AMMONIA in the last 168 hours. Coagulation Profile: No results for input(s): INR, PROTIME in the last 168 hours. Cardiac Enzymes: No results for input(s): CKTOTAL, CKMB, CKMBINDEX, TROPONINI in the last 168 hours. BNP (last 3 results) No results for input(s): PROBNP in the last 8760 hours. HbA1C: No results for input(s): HGBA1C in the last 72 hours. CBG: Recent Labs  Lab 01/10/19 2123 01/11/19 0719 01/11/19 1129 01/11/19 1603 01/11/19 2144  GLUCAP 168* 109* 129* 161* 173*    Lipid Profile: No results for input(s): CHOL, HDL, LDLCALC, TRIG, CHOLHDL, LDLDIRECT in the last 72 hours. Thyroid Function Tests: No results for input(s): TSH, T4TOTAL, FREET4, T3FREE, THYROIDAB in the last 72 hours. Anemia Panel: Recent Labs    01/10/19 0340 01/11/19 0149  FERRITIN 153 233      Radiology Studies: I have reviewed all of the imaging during this hospital visit personally     Scheduled Meds: . albuterol  2 puff Inhalation BID  . cholecalciferol  5,000 Units Oral Daily  . dexamethasone (DECADRON) injection  6 mg Intravenous Q24H  . DULoxetine  60 mg Oral Daily  . enoxaparin (LOVENOX) injection  55 mg Subcutaneous Q24H  . famotidine  20 mg Oral Daily  . fluticasone furoate-vilanterol  1 puff Inhalation Daily  . gabapentin  300 mg Oral QHS  . guaiFENesin-dextromethorphan  10 mL Oral Q4H while awake  . insulin aspart  0-9 Units Subcutaneous TID WC  . ketoconazole  1 application Topical 2 times weekly  . loratadine  10 mg Oral Daily  . magnesium oxide  200 mg Oral Daily  . montelukast  10 mg Oral QHS  .  vitamin C  500 mg Oral Daily  . zinc sulfate  220 mg Oral Daily   Continuous Infusions: . remdesivir 100 mg in NS 250 mL Stopped (01/11/19 1036)     LOS: 3 days        Greg Cratty Annett Gula, MD

## 2019-01-12 NOTE — Progress Notes (Signed)
Called husband to update him. No answer

## 2019-01-12 NOTE — Progress Notes (Signed)
1905 Report received and care assumed from [facility]. Pt resting in bed with call bell in reach. NADN No c/o needs met.

## 2019-01-12 NOTE — Progress Notes (Signed)
"  Pt got up to bathe and has been back in bed for about 71min. I put her on 6L while she was up and she got really SOB. While in bed, I placed her on 8L HFNC. Pt states she gets SOB with exertion and when she tries to take deep breaths. She states she does not really feel any better than when she first came in. She states lying prone helps very little. The only thing to seem to make a difference is tussionex for her cough. Do you think she would be a good candidate for plasma since her O2 requirement seems to be going up daily? Or do you think there is something else I can do for her"?   This message sent to Arrien MD. MD states plasma will be a good idea. Will continue to monitor pt

## 2019-01-13 LAB — CBC WITH DIFFERENTIAL/PLATELET
Abs Immature Granulocytes: 0.02 10*3/uL (ref 0.00–0.07)
Basophils Absolute: 0 10*3/uL (ref 0.0–0.1)
Basophils Relative: 0 %
Eosinophils Absolute: 0 10*3/uL (ref 0.0–0.5)
Eosinophils Relative: 0 %
HCT: 43.5 % (ref 36.0–46.0)
Hemoglobin: 13.9 g/dL (ref 12.0–15.0)
Immature Granulocytes: 0 %
Lymphocytes Relative: 30 %
Lymphs Abs: 1.4 10*3/uL (ref 0.7–4.0)
MCH: 28.5 pg (ref 26.0–34.0)
MCHC: 32 g/dL (ref 30.0–36.0)
MCV: 89.3 fL (ref 80.0–100.0)
Monocytes Absolute: 0.4 10*3/uL (ref 0.1–1.0)
Monocytes Relative: 7 %
Neutro Abs: 2.9 10*3/uL (ref 1.7–7.7)
Neutrophils Relative %: 63 %
Platelets: 365 10*3/uL (ref 150–400)
RBC: 4.87 MIL/uL (ref 3.87–5.11)
RDW: 13.6 % (ref 11.5–15.5)
WBC: 4.7 10*3/uL (ref 4.0–10.5)
nRBC: 0 % (ref 0.0–0.2)

## 2019-01-13 LAB — COMPREHENSIVE METABOLIC PANEL
ALT: 53 U/L — ABNORMAL HIGH (ref 0–44)
AST: 44 U/L — ABNORMAL HIGH (ref 15–41)
Albumin: 3.2 g/dL — ABNORMAL LOW (ref 3.5–5.0)
Alkaline Phosphatase: 69 U/L (ref 38–126)
Anion gap: 11 (ref 5–15)
BUN: 15 mg/dL (ref 6–20)
CO2: 26 mmol/L (ref 22–32)
Calcium: 8.5 mg/dL — ABNORMAL LOW (ref 8.9–10.3)
Chloride: 100 mmol/L (ref 98–111)
Creatinine, Ser: 0.79 mg/dL (ref 0.44–1.00)
GFR calc Af Amer: >60 mL/min (ref >60)
GFR calc non Af Amer: >60 mL/min (ref >60)
Glucose, Bld: 167 mg/dL — ABNORMAL HIGH (ref 70–99)
Potassium: 4.3 mmol/L (ref 3.5–5.1)
Sodium: 137 mmol/L (ref 135–145)
Total Bilirubin: 0.4 mg/dL (ref 0.3–1.2)
Total Protein: 6.8 g/dL (ref 6.5–8.1)

## 2019-01-13 LAB — C-REACTIVE PROTEIN: CRP: 1 mg/dL — ABNORMAL HIGH (ref <1.0)

## 2019-01-13 LAB — FERRITIN: Ferritin: 230 ng/mL (ref 11–307)

## 2019-01-13 LAB — GLUCOSE, CAPILLARY
Glucose-Capillary: 128 mg/dL — ABNORMAL HIGH (ref 70–99)
Glucose-Capillary: 134 mg/dL — ABNORMAL HIGH (ref 70–99)
Glucose-Capillary: 165 mg/dL — ABNORMAL HIGH (ref 70–99)
Glucose-Capillary: 216 mg/dL — ABNORMAL HIGH (ref 70–99)

## 2019-01-13 LAB — D-DIMER, QUANTITATIVE: D-Dimer, Quant: <0.27 ug/mL-FEU (ref 0.00–0.50)

## 2019-01-13 NOTE — Progress Notes (Signed)
PROGRESS NOTE    Meredith FergusonJennifer Olson  WUJ:[id number] DOB: 12-Sep-1970 DOA: 01/08/2019 PCP: Lucianne LeiUppin, Nina, MD    Brief Narrative:  48 year old female who presented with fever. She does have significant past medical history for type IIdiabetesmellitus, asthma and fibromyalgia. October 6 she was seen in the emergency department for apparently an ear infection, and was placed on Augmentin. Due to persistent fevers she returned to the emergency department where her oxygen saturation was found to be 88%.  Sodium 136, potassium 3.5, chloride 101, bicarb 25, glucose 172, BUN 15, creatinine 0.83, AST 92, ALT 95, procalcitonin less than 0.10, white count 5.2, hemoglobin 13.9, hematocrit 42.9, platelets 423. Her chest radiograph negative for infiltrates.  Patient was admitted to the hospital with a working diagnosis ofsuspectedviral pneumonia,SARS COVID-19.  Herpretest probability for viral pneumonia is veryhigh.Patientstarted onRemdesivir andsystemic steroids  Patient continue to have increased oxygen requirements, now up to 2,5 LPM per NCto maintain oxygen saturation greater than 88%.  Over the course of her hospitalization patient has become more symptomatic with fever, dyspnea and increased oxygen requirements. Chest film with new bilateral interstitial infiltrates bases and right upper lobe.  Has required from 4 LPM to 8 LPM per [place].    Assessment & Plan:   Principal Problem:   Respiratory failure with hypoxia (HCC) Active Problems:   Pneumonia due to COVID-19 virus   Viral pneumonia   Obesity (BMI 35.0-39.9 without comorbidity)     1. Acute hypoxic respiratory failure due to SARS COVID 19 viral pneumonia.Improved oxygen requirements, dyspnea and cough also improving, patient is tolerating po well.   RR: 18 bpm Fi02: 21% room air. Oxygen saturation:92%  Ferritin: 129<148<153<233>230 CRP: 4.1>1.6<5.7>4.4>1.8>1.0 D dimer 0.60>0.43>0.31>0.27<0.29>0.27   Tolerating well IV Remdesivir# 5/5. AST 44 , ALT 53. On systemic steroids with dexamethasone, continue withvitamin CandZinc. Cough control with guaifenesin dm.  2. T2DM.Fasting glucose this am is 167. Improved po intake, will continueglucose cover and monitoring with insulin sliding scale.  3. Asthma. No clinical signs of acute exacerbation. Onbreo and albuterol.  4. Elevated liver enzymes. Continue with Remdesivir, continue improvement in liver profile with AST 44 from 50 and ALT 53 from 59.   5. Obesity.Hercalculated BMI is 37. Will need outpatient follow up.   6. Depression.Continue with duloxetine.  DVT prophylaxis:enoxaparin Code Status:full Family Communication:no family at the bedside Disposition Plan/ discharge barriers:possible dc in am, home.   Body mass index is 37.17 kg/m. Malnutrition Type:      Malnutrition Characteristics:      Nutrition Interventions:     RN Pressure Injury Documentation:     Consultants:     Procedures:     Antimicrobials:   Remdesivir.     Subjective: Patient continue to have dyspnea and cough, but improved from yesterday, has been on prone position and out of bed. Decreasing oxygen requirements. No nausea or vomiting.   Objective: Vitals:   01/12/19 1837 01/12/19 1925 01/13/19 0500 01/13/19 0723  BP:  106/69 103/72 107/69  Pulse:  73  63  Resp:  18 18 18   Temp:  99.1 F (37.3 C) 98.7 F (37.1 C) 98.3 F (36.8 C)  TempSrc:  Oral Oral Oral  SpO2: 92% 90%  92%  Weight:      Height:        Intake/Output Summary (Last 24 hours) at 01/13/2019 0811 Last data filed at 01/12/2019 1600 Gross per 24 hour  Intake 970 ml  Output -  Net 970 ml   Filed Weights   01/08/19 1148  Weight: 110.9 kg    Examination:   General: Not in pain or dyspnea, deconditioned  Neurology: Awake and alert, non focal  E ENT: mild pallor, no icterus, oral mucosa moist Cardiovascular: No JVD. S1-S2 present,  rhythmic, no gallops, rubs, or murmurs. No lower extremity edema. Pulmonary: positive breath sounds bilaterally. Gastrointestinal. Abdomen with no organomegaly, non tender, no rebound or guarding Skin. No rashes Musculoskeletal: no joint deformities     Data Reviewed: I have personally reviewed following labs and imaging studies  CBC: Recent Labs  Lab 01/09/19 0430 01/10/19 0340 01/11/19 0149 01/12/19 0500 01/13/19 0129  WBC 8.7 8.2 4.2 4.1 4.7  NEUTROABS 5.9 6.0 2.4 2.1 2.9  HGB 14.1 13.8 13.6 13.6 13.9  HCT 44.1 42.8 43.2 42.8 43.5  MCV 89.8 90.3 90.2 89.4 89.3  PLT 447* 380 346 371 277   Basic Metabolic Panel: Recent Labs  Lab 01/09/19 0430 01/10/19 0340 01/11/19 0149 01/12/19 0500 01/13/19 0129  NA 137 135 137 137 137  K 4.0 3.5 4.2 4.2 4.3  CL 99 95* 98 100 100  CO2 28 28 27 27 26   GLUCOSE 135* 102* 144* 148* 167*  BUN 22* 16 17 15 15   CREATININE 0.93 0.85 0.84 0.80 0.79  CALCIUM 8.8* 8.5* 8.4* 8.3* 8.5*  MG 2.5* 2.2 2.5* 2.3  --    GFR: Estimated Creatinine Clearance: 109.8 mL/min (by C-G formula based on SCr of 0.79 mg/dL). Liver Function Tests: Recent Labs  Lab 01/09/19 0430 01/10/19 0340 01/11/19 0149 01/12/19 0500 01/13/19 0129  AST 63* 47* 66* 50* 44*  ALT 79* 62* 69* 59* 53*  ALKPHOS 84 75 70 67 69  BILITOT 0.8 0.3 0.5 0.3 0.4  PROT 7.4 7.0 6.8 6.6 6.8  ALBUMIN 3.8 3.8 3.4* 3.2* 3.2*   No results for input(s): LIPASE, AMYLASE in the last 168 hours. No results for input(s): AMMONIA in the last 168 hours. Coagulation Profile: No results for input(s): INR, PROTIME in the last 168 hours. Cardiac Enzymes: No results for input(s): CKTOTAL, CKMB, CKMBINDEX, TROPONINI in the last 168 hours. BNP (last 3 results) No results for input(s): PROBNP in the last 8760 hours. HbA1C: No results for input(s): HGBA1C in the last 72 hours. CBG: Recent Labs  Lab 01/12/19 0832 01/12/19 1132 01/12/19 1714 01/12/19 2116 01/13/19 0714  GLUCAP 110* 134*  150* 180* 128*   Lipid Profile: No results for input(s): CHOL, HDL, LDLCALC, TRIG, CHOLHDL, LDLDIRECT in the last 72 hours. Thyroid Function Tests: No results for input(s): TSH, T4TOTAL, FREET4, T3FREE, THYROIDAB in the last 72 hours. Anemia Panel: Recent Labs    01/11/19 0149 01/13/19 0129  FERRITIN 233 32      Radiology Studies: I have reviewed all of the imaging during this hospital visit personally     Scheduled Meds: . albuterol  2 puff Inhalation BID  . cholecalciferol  5,000 Units Oral Daily  . dexamethasone (DECADRON) injection  6 mg Intravenous Q24H  . DULoxetine  60 mg Oral Daily  . enoxaparin (LOVENOX) injection  55 mg Subcutaneous Q24H  . famotidine  20 mg Oral Daily  . fluticasone furoate-vilanterol  1 puff Inhalation Daily  . gabapentin  300 mg Oral QHS  . guaiFENesin-dextromethorphan  10 mL Oral Q4H while awake  . insulin aspart  0-9 Units Subcutaneous TID WC  . ketoconazole  1 application Topical 2 times weekly  . loratadine  10 mg Oral Daily  . magnesium oxide  200 mg Oral Daily  . montelukast  10  mg Oral QHS  . vitamin C  500 mg Oral Daily  . zinc sulfate  220 mg Oral Daily   Continuous Infusions: . remdesivir 100 mg in NS 250 mL Stopped (01/12/19 1036)     LOS: 4 days        Andree Heeg Annett Gula, MD

## 2019-01-13 NOTE — Progress Notes (Signed)
Patient's spouse updated by telephone.  He is hopeful that patient will be able to come home tomorrow.  All questions welcomed and answered.

## 2019-01-13 NOTE — Progress Notes (Signed)
I spoke over the phone with the patient's husband about patient's  condition, plan of care and all questions were addressed.

## 2019-01-14 LAB — GLUCOSE, CAPILLARY
Glucose-Capillary: 117 mg/dL — ABNORMAL HIGH (ref 70–99)
Glucose-Capillary: 130 mg/dL — ABNORMAL HIGH (ref 70–99)

## 2019-01-14 MED ORDER — ACETAMINOPHEN 325 MG PO TABS: 650.0000 mg | ORAL_TABLET | Freq: Four times a day (QID) | ORAL | 0 refills | Status: DC | PRN

## 2019-01-14 MED ORDER — GUAIFENESIN-DM 100-10 MG/5ML PO SYRP: 10.0000 mL | ORAL_SOLUTION | Freq: Four times a day (QID) | ORAL | 0 refills | Status: DC | PRN

## 2019-01-14 NOTE — Discharge Summary (Signed)
Physician Discharge Summary  Meredith Olson MOQ:[id number] DOB: 17-Mar-1971 DOA: 01/08/2019  PCP: Lucianne Lei, MD  Admit date: 01/08/2019 Discharge date: 01/14/2019  Admitted From: Home  Disposition:  Home   Recommendations for Outpatient Follow-up and new medication changes:  1. Follow up with Dr Mathis Bud in 2 weeks.  2. Please continue self quarantine, maintain physical distancing and use a mask in public for the next 14 days.   Home Health: no   Equipment/Devices: no    Discharge Condition: stable CODE STATUS: full Diet recommendation: Diabetic prudent.   Brief/Interim Summary: 48 year old female who presented with fever. She does have significant past medical history for type IIdiabetesmellitus, asthma and fibromyalgia. October 6 she was seen in the emergency department for apparently an ear infection, and was placed on Augmentin. Due to persistent fevers she returned to the emergency department where her oxygen saturation was found to be 88%, blood pressure 119/73, heart rate 72, respiratory 21, temperature 98.6, she had clear lungs to auscultation bilaterally, heart S1-S2 present and rhythmic, abdomen soft, no lower extremity edema. Sodium 136, potassium 3.5, chloride 101, bicarb 25, glucose 172, BUN 15, creatinine 0.83, AST 92, ALT 95, procalcitonin less than 0.10, white count 5.2, hemoglobin 13.9, hematocrit 42.9, platelets 423. Her chest radiograph negative for infiltrates.  Patient was admitted to the hospital with a working diagnosis ofsuspectedviral pneumonia,SARS COVID-19.  Herpretest probability for viral pneumonia was veryhigh and patientwas started onRemdesivir andsystemic steroids  Patient continue to have increased oxygen requirements, andover the course of her hospitalization patient turned more symptomatic with fever and dyspnea. Follow up chest film showed new bilateral interstitial infiltrates at bases and right upper lobe.  She responded well to  medical therapy with improvement of her symptoms and oxygen requirements.  1.  Acute hypoxic respiratory failure due to SARS COVID-19 viral pneumonia, present on admission.  Patient was admitted to the medical ward, she received supplemental oxygen per nasal cannula, intravenous Remdesivir and systemic corticosteroids with dexamethasone.  Patient responded well to medical therapy with improvement in her symptoms, oxygen requirements, and inflammatory markers.  At her discharged her oximetry saturation was 90 to 92% on room air.  Patient will have an ambulatory oximetry before discharge.  2.  Asthma.  She did not have any signs of asthma exacerbation, patient was continued on bronchodilator therapy, with Breo and albuterol.  3.  Type 2 diabetes mellitus.  Her glucose remained stable despite high doses of systemic steroids.  She was placed on insulin sliding scale, for glucose coverage and monitoring.  4.  Transitory elevation of liver enzymes.  Her liver profile was closely monitored while hospitalized, her discharge AST is 44, ALT 53.  5.  Obesity.  Her calculated BMI is 37.1, will need outpatient follow-up.  6.  Depression.  Continue duloxetine.    Discharge Diagnoses:  Principal Problem:   Respiratory failure with hypoxia (HCC) Active Problems:   Pneumonia due to COVID-19 virus   Viral pneumonia   Obesity (BMI 35.0-39.9 without comorbidity)    Discharge Instructions   Allergies as of 01/14/2019      Reactions   Latex Itching   Red rash   Aspirin    Asthma flareup   Ibuprofen    Asthma flare up   Shellfish Allergy Hives      Medication List    STOP taking these medications   amoxicillin-clavulanate 875-125 MG tablet Commonly known as: AUGMENTIN     TAKE these medications   acetaminophen 325 MG tablet Commonly known as:  TYLENOL Take 2 tablets (650 mg total) by mouth every 6 (six) hours as needed for mild pain or headache (fever >/= 101).   albuterol 108 (90 Base)  MCG/ACT inhaler Commonly known as: VENTOLIN HFA Inhale 1-2 puffs into the lungs every 6 (six) hours as needed for wheezing or shortness of breath.   budesonide-formoterol 160-4.5 MCG/ACT inhaler Commonly known as: SYMBICORT Inhale 2 puffs into the lungs 2 (two) times daily.   cetirizine 10 MG tablet Commonly known as: ZYRTEC Take 10 mg by mouth daily as needed for allergies.   DRY EYES OP Apply 2 drops to eye daily as needed (for allergies or dry eyes).   DULoxetine 60 MG capsule Commonly known as: CYMBALTA Take 60 mg by mouth daily.   gabapentin 300 MG capsule Commonly known as: NEURONTIN Take 300 mg by mouth 3 (three) times daily. Patient only take it once at night   guaiFENesin-dextromethorphan 100-10 MG/5ML syrup Commonly known as: ROBITUSSIN DM Take 10 mLs by mouth every 6 (six) hours as needed for cough.   ketoconazole 2 % shampoo Commonly known as: NIZORAL Apply 1 application topically 2 (two) times a week.   montelukast 10 MG tablet Commonly known as: SINGULAIR Take 10 mg by mouth at bedtime.   Vitamin D 125 MCG (5000 UT) Caps Take 1 capsule by mouth daily.       Allergies  Allergen Reactions  . Latex Itching    Red rash  . Aspirin     Asthma flareup  . Ibuprofen     Asthma flare up  . Shellfish Allergy Hives    Consultations:     Procedures/Studies: Dg Chest 1 View  Result Date: 01/11/2019 CLINICAL DATA:  Coronavirus infection.  Shortness of breath. EXAM: CHEST  1 VIEW COMPARISON:  01/09/2019 FINDINGS: Worsening bilateral patchy pulmonary infiltrates and some atelectasis. No lobar consolidation or collapse. No effusions. Heart and mediastinal shadows remain normal. IMPRESSION: Slight worsening of patchy bilateral pulmonary infiltrates with mild linear atelectasis Electronically Signed   By: Nelson Chimes M.D.   On: 01/11/2019 10:20   Dg Chest Port 1 View  Result Date: 01/09/2019 CLINICAL DATA:  Pneumonia due to [place] EXAM: PORTABLE CHEST 1 VIEW  COMPARISON:  Yesterday FINDINGS: Normal heart size and mediastinal contours. No acute infiltrate or edema. No effusion or pneumothorax. No acute osseous findings. IMPRESSION: Negative chest. Electronically Signed   By: Monte Fantasia M.D.   On: 01/09/2019 06:59      Procedures:   Subjective: Patient is feeling better, continue to improve dyspnea, intermittent cough, no nausea or vomiting.   Discharge Exam: Vitals:   01/14/19 0418 01/14/19 0746  BP: 99/65 107/71  Pulse: 64 60  Resp: 18 16  Temp: 98.2 F (36.8 C) 98.1 F (36.7 C)  SpO2: 92% 90%   Vitals:   01/13/19 1658 01/13/19 2012 01/14/19 0418 01/14/19 0746  BP: 108/67 107/65 99/65 107/71  Pulse: 71 65 64 60  Resp: 18  18 16   Temp: 98.6 F (37 C) 99.7 F (37.6 C) 98.2 F (36.8 C) 98.1 F (36.7 C)  TempSrc: Oral Oral  Oral  SpO2: 94% 94% 92% 90%  Weight:      Height:        General: Not in pain or dyspnea.  Neurology: Awake and alert, non focal  E ENT: no pallor, no icterus, oral mucosa moist Cardiovascular: No JVD. S1-S2 present, rhythmic, no gallops, rubs, or murmurs. No lower extremity edema. Pulmonary: positive breath sounds bilaterally, no wheezing,  no rales or rhonchi. Gastrointestinal. Abdomen flat, no organomegaly, non tender, no rebound or guarding Skin. No rashes Musculoskeletal: no joint deformities   The results of significant diagnostics from this hospitalization (including imaging, microbiology, ancillary and laboratory) are listed below for reference.     Microbiology: No results found for this or any previous visit (from the past 240 hour(s)).   Labs: BNP (last 3 results) No results for input(s): BNP in the last 8760 hours. Basic Metabolic Panel: Recent Labs  Lab 01/09/19 0430 01/10/19 0340 01/11/19 0149 01/12/19 0500 01/13/19 0129  NA 137 135 137 137 137  K 4.0 3.5 4.2 4.2 4.3  CL 99 95* 98 100 100  CO2 28 28 27 27 26   GLUCOSE 135* 102* 144* 148* 167*  BUN 22* 16 17 15 15    CREATININE 0.93 0.85 0.84 0.80 0.79  CALCIUM 8.8* 8.5* 8.4* 8.3* 8.5*  MG 2.5* 2.2 2.5* 2.3  --    Liver Function Tests: Recent Labs  Lab 01/09/19 0430 01/10/19 0340 01/11/19 0149 01/12/19 0500 01/13/19 0129  AST 63* 47* 66* 50* 44*  ALT 79* 62* 69* 59* 53*  ALKPHOS 84 75 70 67 69  BILITOT 0.8 0.3 0.5 0.3 0.4  PROT 7.4 7.0 6.8 6.6 6.8  ALBUMIN 3.8 3.8 3.4* 3.2* 3.2*   No results for input(s): LIPASE, AMYLASE in the last 168 hours. No results for input(s): AMMONIA in the last 168 hours. CBC: Recent Labs  Lab 01/09/19 0430 01/10/19 0340 01/11/19 0149 01/12/19 0500 01/13/19 0129  WBC 8.7 8.2 4.2 4.1 4.7  NEUTROABS 5.9 6.0 2.4 2.1 2.9  HGB 14.1 13.8 13.6 13.6 13.9  HCT 44.1 42.8 43.2 42.8 43.5  MCV 89.8 90.3 90.2 89.4 89.3  PLT 447* 380 346 371 365   Cardiac Enzymes: No results for input(s): CKTOTAL, CKMB, CKMBINDEX, TROPONINI in the last 168 hours. BNP: Invalid input(s): POCBNP CBG: Recent Labs  Lab 01/13/19 0714 01/13/19 1127 01/13/19 1700 01/13/19 2028 01/14/19 0743  GLUCAP 128* 134* 165* 216* 117*   D-Dimer Recent Labs    01/12/19 0500 01/13/19 0129  DDIMER 0.29 <0.27   Hgb A1c No results for input(s): HGBA1C in the last 72 hours. Lipid Profile No results for input(s): CHOL, HDL, LDLCALC, TRIG, CHOLHDL, LDLDIRECT in the last 72 hours. Thyroid function studies No results for input(s): TSH, T4TOTAL, T3FREE, THYROIDAB in the last 72 hours.  Invalid input(s): FREET3 Anemia work up [employer] [facility]    01/13/19 0129  FERRITIN 230   Urinalysis No results found for: COLORURINE, APPEARANCEUR, LABSPEC, PHURINE, GLUCOSEU, HGBUR, BILIRUBINUR, KETONESUR, PROTEINUR, UROBILINOGEN, NITRITE, LEUKOCYTESUR Sepsis Labs Invalid input(s): PROCALCITONIN,  WBC,  LACTICIDVEN Microbiology No results found for this or any previous visit (from the past 240 hour(s)).   Time coordinating discharge: 45 minutes  SIGNED:   Coralie KeensMauricio Daniel Emara Lichter, MD  [facility] 01/14/2019, 8:45 AM

## 2019-01-14 NOTE — Progress Notes (Addendum)
SATURATION QUALIFICATIONS: (This note is used to comply with regulatory documentation for home oxygen)  Patient Saturations on Room Air at Rest = 95%  Patient Saturations on Room Air while Ambulating = 90%  Walked a distance of approximately 160'.  Tolerated well. Mild SOB, quick recovery.  MD notified.

## 2019-01-15 ENCOUNTER — Encounter (HOSPITAL_COMMUNITY): Payer: Self-pay

## 2019-01-15 ENCOUNTER — Other Ambulatory Visit: Payer: Self-pay

## 2019-01-15 ENCOUNTER — Inpatient Hospital Stay (HOSPITAL_COMMUNITY)
Admission: AD | Admit: 2019-01-15 | Discharge: 2019-01-22 | DRG: 177 | Disposition: A | Discharge status: Home Health | Payer: HRSA Program | Source: Other Acute Inpatient Hospital | Attending: Internal Medicine | Admitting: Internal Medicine

## 2019-01-15 DIAGNOSIS — E669 Obesity, unspecified: Secondary | ICD-10-CM | POA: Diagnosis present

## 2019-01-15 DIAGNOSIS — F329 Major depressive disorder, single episode, unspecified: Secondary | ICD-10-CM | POA: Diagnosis present

## 2019-01-15 DIAGNOSIS — Z7951 Long term (current) use of inhaled steroids: Secondary | ICD-10-CM | POA: Diagnosis not present

## 2019-01-15 DIAGNOSIS — J45909 Unspecified asthma, uncomplicated: Secondary | ICD-10-CM | POA: Diagnosis present

## 2019-01-15 DIAGNOSIS — U071 COVID-19: Principal | ICD-10-CM | POA: Diagnosis present

## 2019-01-15 DIAGNOSIS — Z6834 Body mass index (BMI) 34.0-34.9, adult: Secondary | ICD-10-CM

## 2019-01-15 DIAGNOSIS — M797 Fibromyalgia: Secondary | ICD-10-CM | POA: Diagnosis present

## 2019-01-15 DIAGNOSIS — J1282 Pneumonia due to coronavirus disease 2019: Secondary | ICD-10-CM | POA: Diagnosis present

## 2019-01-15 DIAGNOSIS — Z79899 Other long term (current) drug therapy: Secondary | ICD-10-CM

## 2019-01-15 DIAGNOSIS — J9601 Acute respiratory failure with hypoxia: Secondary | ICD-10-CM | POA: Diagnosis present

## 2019-01-15 DIAGNOSIS — T380X5A Adverse effect of glucocorticoids and synthetic analogues, initial encounter: Secondary | ICD-10-CM | POA: Diagnosis present

## 2019-01-15 DIAGNOSIS — E1165 Type 2 diabetes mellitus with hyperglycemia: Secondary | ICD-10-CM | POA: Diagnosis present

## 2019-01-15 DIAGNOSIS — J1289 Other viral pneumonia: Secondary | ICD-10-CM | POA: Diagnosis present

## 2019-01-15 DIAGNOSIS — R7401 Elevation of levels of liver transaminase levels: Secondary | ICD-10-CM | POA: Diagnosis present

## 2019-01-15 DIAGNOSIS — Z23 Encounter for immunization: Secondary | ICD-10-CM

## 2019-01-15 HISTORY — DX: Type 2 diabetes mellitus without complications: E11.9

## 2019-01-15 LAB — GLUCOSE, CAPILLARY
Glucose-Capillary: 210 mg/dL — ABNORMAL HIGH (ref 70–99)
Glucose-Capillary: 291 mg/dL — ABNORMAL HIGH (ref 70–99)

## 2019-01-15 MED ORDER — ENOXAPARIN SODIUM 40 MG/0.4ML ~~LOC~~ SOLN
40.0000 mg | SUBCUTANEOUS | Status: DC
  Administered 2019-01-15: 19:00:00 40 mg via SUBCUTANEOUS
  Filled 2019-01-15: qty 0.4

## 2019-01-15 MED ORDER — ZINC SULFATE 220 (50 ZN) MG PO CAPS
220.0000 mg | ORAL_CAPSULE | Freq: Every day | ORAL | Status: DC
  Administered 2019-01-15 – 2019-01-22 (×8): 220 mg via ORAL
  Filled 2019-01-15 (×9): qty 1

## 2019-01-15 MED ORDER — INSULIN ASPART 100 UNIT/ML ~~LOC~~ SOLN
0.0000 [IU] | Freq: Every day | SUBCUTANEOUS | Status: DC
  Administered 2019-01-15 – 2019-01-16 (×2): 3 [IU] via SUBCUTANEOUS
  Administered 2019-01-17 – 2019-01-19 (×3): 2 [IU] via SUBCUTANEOUS

## 2019-01-15 MED ORDER — GUAIFENESIN-DM 100-10 MG/5ML PO SYRP
10.0000 mL | ORAL_SOLUTION | ORAL | Status: DC | PRN
  Administered 2019-01-15 – 2019-01-22 (×13): 10 mL via ORAL
  Filled 2019-01-15 (×12): qty 10

## 2019-01-15 MED ORDER — INSULIN ASPART 100 UNIT/ML ~~LOC~~ SOLN
0.0000 [IU] | Freq: Three times a day (TID) | SUBCUTANEOUS | Status: DC
  Administered 2019-01-15 – 2019-01-16 (×3): 5 [IU] via SUBCUTANEOUS
  Administered 2019-01-16: 0 [IU] via SUBCUTANEOUS
  Administered 2019-01-17: 18:00:00 5 [IU] via SUBCUTANEOUS
  Administered 2019-01-17: 8 [IU] via SUBCUTANEOUS
  Administered 2019-01-17: 3 [IU] via SUBCUTANEOUS
  Administered 2019-01-18: 5 [IU] via SUBCUTANEOUS

## 2019-01-15 MED ORDER — IPRATROPIUM-ALBUTEROL 20-100 MCG/ACT IN AERS
2.0000 | INHALATION_SPRAY | Freq: Four times a day (QID) | RESPIRATORY_TRACT | Status: DC
  Administered 2019-01-15 – 2019-01-22 (×27): 2 via RESPIRATORY_TRACT
  Filled 2019-01-15: qty 4

## 2019-01-15 MED ORDER — METHYLPREDNISOLONE SODIUM SUCC 125 MG IJ SOLR
60.0000 mg | Freq: Two times a day (BID) | INTRAMUSCULAR | Status: DC
  Administered 2019-01-15 – 2019-01-19 (×8): 60 mg via INTRAVENOUS
  Filled 2019-01-15 (×8): qty 2

## 2019-01-15 MED ORDER — ACETAMINOPHEN 325 MG PO TABS
650.0000 mg | ORAL_TABLET | Freq: Four times a day (QID) | ORAL | Status: DC | PRN
  Administered 2019-01-15 – 2019-01-22 (×4): 650 mg via ORAL
  Filled 2019-01-15 (×4): qty 2

## 2019-01-15 MED ORDER — POLYETHYLENE GLYCOL 3350 17 G PO PACK: 17.0000 g | PACK | Freq: Every day | ORAL | Status: DC | PRN

## 2019-01-15 MED ORDER — VITAMIN C 500 MG PO TABS
500.0000 mg | ORAL_TABLET | Freq: Every day | ORAL | Status: DC
  Administered 2019-01-15 – 2019-01-22 (×8): 500 mg via ORAL
  Filled 2019-01-15 (×8): qty 1

## 2019-01-15 MED ORDER — ONDANSETRON HCL 4 MG/2ML IJ SOLN
4.0000 mg | Freq: Four times a day (QID) | INTRAMUSCULAR | Status: DC | PRN
  Administered 2019-01-15: 4 mg via INTRAVENOUS
  Filled 2019-01-15: qty 2

## 2019-01-15 MED ORDER — HYDROCOD POLST-CPM POLST ER 10-8 MG/5ML PO SUER
5.0000 mL | Freq: Two times a day (BID) | ORAL | Status: DC | PRN
  Administered 2019-01-16 – 2019-01-17 (×2): 5 mL via ORAL
  Filled 2019-01-15 (×2): qty 5

## 2019-01-15 MED ORDER — PNEUMOCOCCAL VAC POLYVALENT 25 MCG/0.5ML IJ INJ
0.5000 mL | INJECTION | INTRAMUSCULAR | Status: DC
  Filled 2019-01-15: qty 0.5

## 2019-01-15 MED ORDER — ONDANSETRON HCL 4 MG PO TABS: 4.0000 mg | ORAL_TABLET | Freq: Four times a day (QID) | ORAL | Status: DC | PRN

## 2019-01-15 NOTE — H&P (Signed)
[facility] History and Physical  Meredith FergusonJennifer Durkee NWG:[id number] DOB: 27-Mar-1971 DOA: 01/15/2019   PCP: Lucianne LeiUppin, Nina, MD  Specialists: None  Chief Complaint: Fatigue  HPI: Meredith Olson is a 48 y.o. female with a past medical history of diabetes mellitus type 2, history of asthma and fibromyalgia was [facility].  Chest x-ray showed bilateral interstitial infiltrates.  Patient was positive for COVID-19.  She was placed on Remdesivir and systemic steroids with dexamethasone.  She initially was requiring about 8 L of oxygen by high flow nasal cannula.  She was weaned down.  Her inflammatory markers improved.  She was ambulated yesterday and was saturating within the normal range on room air.  She was subsequently discharged home.  After discharge patient said that she initially was feeling well.  Subsequently she started feeling more fatigued.  Overnight she developed shortness of breath.  Denies any chest pain.  No orthopnea or PND.  She felt nauseated but did not have any vomiting.  Denies any abdominal pain.  No dysuria, diarrhea, skin rashes, joint pains.  She decided to call EMS.  [facility].  In the emergency department patient was noted to be febrile with a fever of 101 F.  She was noted to be hypoxic with oxygen saturation of 88%.  She was placed on oxygen.  Blood work was done which showed CRP of 2.9 mg/dL.  Chest x-ray showed multifocal pneumonia.  D-dimer was noted to be elevated so a CT angiogram was done which showed no PE.  However patchy opacities were again noted.  Patient's oxygen requirement went up to 5 L in the emergency department.  She was subsequently [facility].  Home Medications: This is medication list has not been reconciled yet. Prior to Admission medications   Medication Sig Start Date End Date Taking? Authorizing Provider   acetaminophen (TYLENOL) 325 MG tablet Take 2 tablets (650 mg total) by mouth every 6 (six) hours as needed for mild pain or headache (fever >/= 101). 01/14/19   Arrien, York RamMauricio Daniel, MD  albuterol (PROVENTIL HFA;VENTOLIN HFA) 108 (90 Base) MCG/ACT inhaler Inhale 1-2 puffs into the lungs every 6 (six) hours as needed for wheezing or shortness of breath.    [provider]  Artificial Tear Ointment (DRY EYES OP) Apply 2 drops to eye daily as needed (for allergies or dry eyes).    [provider]  budesonide-formoterol (SYMBICORT) 160-4.5 MCG/ACT inhaler Inhale 2 puffs into the lungs 2 (two) times daily.    [provider]  cetirizine (ZYRTEC) 10 MG tablet Take 10 mg by mouth daily as needed for allergies.    [provider]  Cholecalciferol (VITAMIN D) 125 MCG (5000 UT) CAPS Take 1 capsule by mouth daily.    [provider]  DULoxetine (CYMBALTA) 60 MG capsule Take 60 mg by mouth daily.  06/05/15   [provider]  gabapentin (NEURONTIN) 300 MG capsule Take 300 mg by mouth 3 (three) times daily. Patient only take it once at night    [provider]  guaiFENesin-dextromethorphan (ROBITUSSIN DM) 100-10 MG/5ML syrup Take 10 mLs by mouth every 6 (six) hours as needed for cough. 01/14/19   Arrien, York RamMauricio Daniel, MD  ketoconazole (NIZORAL) 2 % shampoo Apply 1 application topically 2 (two) times a week.  06/17/15   [provider]  montelukast (SINGULAIR) 10 MG tablet Take 10 mg by mouth at  bedtime.  06/05/15   [provider]    Allergies:  Allergies  Allergen Reactions  . Latex Itching    Red rash  . Aspirin     Asthma flareup  . Ibuprofen     Asthma flare up  . Shellfish Allergy Hives    Past Medical History: Past Medical History:  Diagnosis Date  . Anxiety   . Asthma   . Depression   . Fibromyalgia   . GERD (gastroesophageal reflux disease)   . Headache    migraines  . Shortness of breath dyspnea    due  to asthma    Past Surgical History:  Procedure Laterality Date  . BILATERAL SALPINGECTOMY Bilateral 07/28/2015   Procedure: BILATERAL SALPINGO-OOPHORECTOMY;  Surgeon: Lavina Hamman, MD;  Location: [facility];  Service: Gynecology;  Laterality: Bilateral;  . CHOLECYSTECTOMY    . CYSTOSCOPY N/A 07/28/2015   Procedure: CYSTOSCOPY;  Surgeon: Lavina Hamman, MD;  Location: [facility];  Service: Gynecology;  Laterality: N/A;  . TONSILLECTOMY    . VAGINAL HYSTERECTOMY N/A 07/28/2015   Procedure: HYSTERECTOMY VAGINAL;  Surgeon: Lavina Hamman, MD;  Location: [facility];  Service: Gynecology;  Laterality: N/A;  MD needs 1.5hrs OR time    Social History: Lives with her husband.  No history of smoking alcohol use or illicit drug use.  Usually independent with daily activities.  Family History:  High blood pressure runs in the family  Review of Systems - History obtained from the patient General ROS: positive for  - chills, fatigue and fever Psychological ROS: negative Ophthalmic ROS: negative ENT ROS: negative Allergy and Immunology ROS: negative Hematological and Lymphatic ROS: negative Endocrine ROS: negative Respiratory ROS: As in HPI Cardiovascular ROS: no chest pain or dyspnea on exertion Gastrointestinal ROS: As in HPI Genito-Urinary ROS: no dysuria, trouble voiding, or hematuria Musculoskeletal ROS: negative Neurological ROS: no TIA or stroke symptoms Dermatological ROS: negative  Physical Examination  Vitals:   01/15/19 1605 01/15/19 1640  BP: 110/68   Pulse: 76   Resp: 20   Temp: 98.8 F (37.1 C)   TempSrc: Oral   SpO2: 92% 91%  Weight: 104.3 kg   Height: 5\' 8"  (1.727 m)     BP 110/68 (BP Location: Right Arm)   Pulse 76   Temp 98.8 F (37.1 C) (Oral)   Resp 20   Ht 5\' 8"  (1.727 m)   Wt 104.3 kg   SpO2 91%   BMI 34.97 kg/m   General appearance: alert, cooperative, appears stated age and no distress Head: Normocephalic, without obvious abnormality, atraumatic Eyes:  conjunctivae/corneas clear. PERRL, EOM's intact.  Throat: lips, mucosa, and tongue normal; teeth and gums normal Neck: no adenopathy, no carotid bruit, no JVD, supple, symmetrical, trachea midline and thyroid not enlarged, symmetric, no tenderness/mass/nodules Back: symmetric, no curvature. ROM normal. No CVA tenderness. Resp: Coarse breath sounds bilaterally.  Mildly tachypneic at rest.  Crackles at the bases.  No wheezing or rhonchi. Cardio: regular rate and rhythm, S1, S2 normal, no murmur, click, rub or gallop GI: soft, non-tender; bowel sounds normal; no masses,  no organomegaly Extremities: extremities normal, atraumatic, no cyanosis or edema Pulses: 2+ and symmetric Skin: Skin color, texture, turgor normal. No rashes or lesions Lymph nodes: Cervical, supraclavicular, and axillary nodes normal. Neurologic: Alert and oriented x3.  No focal neurological deficits.    Labs on Admission: I have personally reviewed following labs and imaging studies  Following labs were done at [facility]  Wbc 6.6 hgb 14.3 Plat  366  Na 138 k 3.8 Cl 104 co2 25 Bun 17 Creat 0.7 Gluc 112  Pct 0.05 ldh 812 crp 29.7 mg/l  lactic acid 0.9 inr 1.1  Ferritin 269 Trop 0.01  ast 106 Alt 113 alkp 73    Radiological Exams on Admission: Chest x-ray done at [place] showed multifocal pneumonia.  CT angiogram chest done at [facility] showed no pulmonary embolism.  No pulmonary edema.  Bilateral opacities noted.  My interpretation of Electrocardiogram: Sinus rhythm.  No ischemic changes noted.  Normal intervals.   Problem List  Active Problems:   Pneumonia due to COVID-19 virus   Obesity (BMI 35.0-39.9 without comorbidity)   Acute respiratory failure with hypoxia (HCC)   Transaminitis   Assessment: This is a 48 year old Caucasian female with past medical history as stated earlier who was discharged yesterday from this facility after being managed for pneumonia due to  COVID-19.  Unfortunately patient presented back to the emergency department this morning with fatigue and was found to be febrile and hypoxic.  She has been rehospitalized.  Differential diagnosis include acute pulmonary embolism which has been ruled out by CT angiogram.  No pulmonary edema noted on the CT scan either.  This could just be worsening of her pneumonia.  Plan:  1. Pneumonia due to COVID-19/acute respiratory failure with hypoxia: Patient with recurrence of her symptoms.  Noted to be quite hypoxic requiring 5 L of oxygen.  CT angiogram reassuringly does not show any PE.  Patient recently completed a 5-day course of Remdesivir.  We will place her back on steroids for now.  CRP is only minimally elevated.  No evidence of bacterial infection noted with normal procalcitonin.  WBC was normal.  Lactic acid level was also normal.  No other evidence of infection either.  We will hold off on antibacterials at this time.  2.  Class I obesity: According to nurses who took care of her yesterday patient needs a lot of motivation to get up and move around.  Her lack of activity could have also contributed some.  Incentive spirometry, mobilization.  3. History of diabetes mellitus type 2, uncontrolled with hyperglycemia: HbA1c was 7.2 during her previous hospitalization.  Unclear if the patient takes any diabetic medications at home.  We will place her on sliding scale coverage.  Anticipate some worsening in her glycemic control due to steroids.  4. History of asthma: No evidence for exacerbation at this time.  Continue with bronchodilator treatment.  5. Transaminitis: Most likely due to COVID-19.  Abdominal cuts on the CT scan done does suggest fatty liver.  Continue to monitor trends.  6. History of depression: Apparently on Cymbalta which will need to be verified.  DVT Prophylaxis: Lovenox Code Status: Full code Family Communication: Discussed with the patient Disposition: MedSurg Consults called:  None Admission Status: Inpatient  Severity of Illness: The appropriate patient status for this patient is INPATIENT. Inpatient status is judged to be reasonable and necessary in order to provide the required intensity of service to ensure the patient's safety. The patient's presenting symptoms, physical exam findings, and initial radiographic and laboratory data in the context of their chronic comorbidities is felt to place them at high risk for further clinical deterioration. Furthermore, it is not anticipated that the patient will be medically stable for discharge from the hospital within 2 midnights of admission. The following factors support the patient status of inpatient.   " The patient's presenting symptoms include shortness of breath, fatigue. " The  worrisome physical exam findings include hypoxia. " The initial radiographic and laboratory data are worrisome because of worsening opacities on CT angiogram.. " The chronic co-morbidities include obesity.   * I certify that at the point of admission it is my clinical judgment that the patient will require inpatient hospital care spanning beyond 2 midnights from the point of admission due to high intensity of service, high risk for further deterioration and high frequency of surveillance required.*   Further management decisions will depend on results of further testing and patient's response to treatment.   Welles W[facility]all [facility]  [facility] on [email or web address]  01/15/2019, 5:08 PM

## 2019-01-15 NOTE — Progress Notes (Signed)
Covid patient with recent stay at [facility] 01-08-2019 to 01-14-2019. Patient readmitted today 01/15/2019 for weakness, increased DOB and fever (ED rectal temp 101.8) Patient transferred to [facility] from [facility] via EMS. Pt admit assessment and documentation with POC review completed. Patient oriented to room, unit, call bell and encouraged to call before ambulating to restroom. Patient VSS, denies pain only complains of fatigue and SOB. Pt currently on 6L [place] with 02 saturation 92%. Primary RN called spouse during admission assessment to review POC and patient status, both parties verbalized comprehension of teaching. Dr. [place] Pink also assessed patient upon arrival to unit. Primary RN will continue POC as ordered, document and report any assessment changes to MD.

## 2019-01-15 NOTE — Plan of Care (Signed)
POC established

## 2019-01-16 LAB — CBC WITH DIFFERENTIAL/PLATELET
Abs Immature Granulocytes: 0.07 10*3/uL (ref 0.00–0.07)
Basophils Absolute: 0 10*3/uL (ref 0.0–0.1)
Basophils Relative: 0 %
Eosinophils Absolute: 0 10*3/uL (ref 0.0–0.5)
Eosinophils Relative: 0 %
HCT: 44.7 % (ref 36.0–46.0)
Hemoglobin: 14.5 g/dL (ref 12.0–15.0)
Immature Granulocytes: 1 %
Lymphocytes Relative: 19 %
Lymphs Abs: 1 10*3/uL (ref 0.7–4.0)
MCH: 28.4 pg (ref 26.0–34.0)
MCHC: 32.4 g/dL (ref 30.0–36.0)
MCV: 87.6 fL (ref 80.0–100.0)
Monocytes Absolute: 0.3 10*3/uL (ref 0.1–1.0)
Monocytes Relative: 6 %
Neutro Abs: 3.8 10*3/uL (ref 1.7–7.7)
Neutrophils Relative %: 74 %
Platelets: 432 10*3/uL — ABNORMAL HIGH (ref 150–400)
RBC: 5.1 MIL/uL (ref 3.87–5.11)
RDW: 13.3 % (ref 11.5–15.5)
WBC: 5.1 10*3/uL (ref 4.0–10.5)
nRBC: 0 % (ref 0.0–0.2)

## 2019-01-16 LAB — COMPREHENSIVE METABOLIC PANEL
ALT: 87 U/L — ABNORMAL HIGH (ref 0–44)
AST: 43 U/L — ABNORMAL HIGH (ref 15–41)
Albumin: 3.3 g/dL — ABNORMAL LOW (ref 3.5–5.0)
Alkaline Phosphatase: 64 U/L (ref 38–126)
Anion gap: 11 (ref 5–15)
BUN: 21 mg/dL — ABNORMAL HIGH (ref 6–20)
CO2: 24 mmol/L (ref 22–32)
Calcium: 8.6 mg/dL — ABNORMAL LOW (ref 8.9–10.3)
Chloride: 101 mmol/L (ref 98–111)
Creatinine, Ser: 0.78 mg/dL (ref 0.44–1.00)
GFR calc Af Amer: >60 mL/min (ref >60)
GFR calc non Af Amer: >60 mL/min (ref >60)
Glucose, Bld: 251 mg/dL — ABNORMAL HIGH (ref 70–99)
Potassium: 3.8 mmol/L (ref 3.5–5.1)
Sodium: 136 mmol/L (ref 135–145)
Total Bilirubin: 0.3 mg/dL (ref 0.3–1.2)
Total Protein: 7.1 g/dL (ref 6.5–8.1)

## 2019-01-16 LAB — GLUCOSE, CAPILLARY
Glucose-Capillary: 241 mg/dL — ABNORMAL HIGH (ref 70–99)
Glucose-Capillary: 246 mg/dL — ABNORMAL HIGH (ref 70–99)
Glucose-Capillary: 215 mg/dL — ABNORMAL HIGH (ref 70–99)
Glucose-Capillary: 293 mg/dL — ABNORMAL HIGH (ref 70–99)

## 2019-01-16 LAB — BRAIN NATRIURETIC PEPTIDE: B Natriuretic Peptide: 19.7 pg/mL (ref 0.0–100.0)

## 2019-01-16 LAB — TYPE AND SCREEN
ABO/RH(D): O POS
Antibody Screen: NEGATIVE

## 2019-01-16 LAB — C-REACTIVE PROTEIN: CRP: 5.5 mg/dL — ABNORMAL HIGH (ref <1.0)

## 2019-01-16 LAB — FERRITIN: Ferritin: 224 ng/mL (ref 11–307)

## 2019-01-16 LAB — D-DIMER, QUANTITATIVE: D-Dimer, Quant: 0.43 ug/mL-FEU (ref 0.00–0.50)

## 2019-01-16 LAB — MAGNESIUM: Magnesium: 2.5 mg/dL — ABNORMAL HIGH (ref 1.7–2.4)

## 2019-01-16 LAB — HEPATITIS B SURFACE ANTIGEN: Hepatitis B Surface Ag: NONREACTIVE

## 2019-01-16 MED ORDER — DULOXETINE HCL 60 MG PO CPEP
60.0000 mg | ORAL_CAPSULE | Freq: Every day | ORAL | Status: DC
  Administered 2019-01-16 – 2019-01-22 (×7): 60 mg via ORAL
  Filled 2019-01-16 (×8): qty 1

## 2019-01-16 MED ORDER — ENOXAPARIN SODIUM 60 MG/0.6ML ~~LOC~~ SOLN
55.0000 mg | SUBCUTANEOUS | Status: DC
  Administered 2019-01-16 – 2019-01-21 (×6): 55 mg via SUBCUTANEOUS
  Filled 2019-01-16 (×6): qty 0.6

## 2019-01-16 MED ORDER — FUROSEMIDE 10 MG/ML IJ SOLN
20.0000 mg | Freq: Once | INTRAMUSCULAR | Status: AC
  Administered 2019-01-16: 20 mg via INTRAVENOUS
  Filled 2019-01-16: qty 2

## 2019-01-16 MED ORDER — ACETAMINOPHEN 325 MG PO TABS
650.0000 mg | ORAL_TABLET | Freq: Once | ORAL | Status: AC
  Administered 2019-01-16: 650 mg via ORAL
  Filled 2019-01-16: qty 2

## 2019-01-16 MED ORDER — OXYMETAZOLINE HCL 0.05 % NA SOLN
1.0000 | Freq: Two times a day (BID) | NASAL | Status: DC | PRN
  Filled 2019-01-16: qty 15

## 2019-01-16 MED ORDER — SODIUM CHLORIDE 0.9% IV SOLUTION
Freq: Once | INTRAVENOUS | Status: AC
  Administered 2019-01-16: 13:00:00 via INTRAVENOUS

## 2019-01-16 MED ORDER — TOCILIZUMAB 400 MG/20ML IV SOLN
800.0000 mg | Freq: Once | INTRAVENOUS | Status: AC
  Administered 2019-01-16: 13:00:00 800 mg via INTRAVENOUS
  Filled 2019-01-16: qty 40

## 2019-01-16 MED ORDER — INSULIN GLARGINE 100 UNIT/ML ~~LOC~~ SOLN
15.0000 [IU] | Freq: Every day | SUBCUTANEOUS | Status: DC
  Administered 2019-01-16 – 2019-01-17 (×2): 15 [IU] via SUBCUTANEOUS
  Filled 2019-01-16 (×3): qty 0.15

## 2019-01-16 MED ORDER — DIPHENHYDRAMINE HCL 50 MG/ML IJ SOLN
25.0000 mg | Freq: Once | INTRAMUSCULAR | Status: AC
  Administered 2019-01-16: 12:00:00 25 mg via INTRAVENOUS
  Filled 2019-01-16: qty 1

## 2019-01-16 NOTE — Progress Notes (Signed)
Ok to increase lovenox to 0.5mg /kg/day for DVT prophylaxis per Dr.Ghimire.  Onnie Boer, PharmD, BCIDP, AAHIVP, CPP Infectious Disease Pharmacist 01/16/2019 10:35 AM

## 2019-01-16 NOTE — Progress Notes (Signed)
1955: Pt requesting her home medications, especially Cymbalta. Provider notified  2130: Patient declines this RN to call family for update.

## 2019-01-16 NOTE — Plan of Care (Signed)
Pt slept well during the night. Complained of mild headache and nausea, prn meds given with good effect. Alert and oriented. Noted dyspnea on exertion and dry cough, prn Robitussin given. O2 bumped up to 8L via HFNC salter as it O2 sats continued to be around 88% while asleep. Incentive exercises started, also encouraged with coughing and deep breathing exercises. Minimal assist with ADLs. Standby assist to the [facility] for toileting needs. Skin assessed, intact.  IV  SL. Only tolerated prone positioning for about 1hr overnight despite education and encouragement. No other issues, will monitor.   Problem: Education: Goal: Knowledge of risk factors and measures for prevention of condition will improve Outcome: Progressing   Problem: Coping: Goal: Psychosocial and spiritual needs will be supported Outcome: Progressing   Problem: Respiratory: Goal: Will maintain a patent airway Outcome: Progressing Goal: Complications related to the disease process, condition or treatment will be avoided or minimized Outcome: Progressing

## 2019-01-16 NOTE — Progress Notes (Signed)
Inpatient Diabetes Program Recommendations  AACE/ADA: New Consensus Statement on Inpatient Glycemic Control (2015)  Target Ranges:  Prepandial:   less than 140 mg/dL      Peak postprandial:   less than 180 mg/dL (1-2 hours)      Critically ill patients:  140 - 180 mg/dL   Lab Results  Component Value Date   GLUCAP 241 (H) 01/16/2019   HGBA1C 7.2 (H) 01/09/2019    Review of Glycemic Control Results for BEYOUNCE, DICKENS (MRN [id number]) as of 01/16/2019 11:00  Ref. Range 01/14/2019 07:43 01/14/2019 11:32 01/15/2019 17:16 01/15/2019 21:12 01/16/2019 07:43  Glucose-Capillary Latest Ref Range: 70 - 99 mg/dL 117 (H) 130 (H) 210 (H) 291 (H) 241 (H)   Diabetes history: DM2 Outpatient Diabetes medications: None Current orders for Inpatient glycemic control: Novolog moderate correction tid + hs 0-5 units  Inpatient Diabetes Program Recommendations:   Noted patient not eating well consistently. -Increase Novolog correction moderate tid + hs 0-5 units  Thank you, Bethena Roys E. Adali Pennings, RN, MSN, CDE  Diabetes Coordinator Inpatient Glycemic Control Team Team Pager [phone] (8am-5pm) 01/16/2019 11:28 AM

## 2019-01-16 NOTE — Progress Notes (Signed)
The rationale for the off label use of Actemra its known side effects, potential benefits was  discussed with patient and her husband over the phone.The use of Actemra is based on published clinical articles/anecdotal data as there is no demonstrated efficacy in several randomized trials-although other trial are ongoing. Complete risks and long-term side effects are unknown, however in the best clinical judgment it is felt that the clinical benefit at this time outweighs medical risks given tenuous clinical state of the patient.  Patient and spouse agree with the treatment plan and consent to the use of Actemra.

## 2019-01-16 NOTE — Progress Notes (Addendum)
PROGRESS NOTE                                                                                                                                                                                                             Patient Demographics:    Meredith Olson, is a 48 y.o. female, DOB - 03-03-1971, EHU:[id number]  Outpatient Primary MD for the patient is Lucianne Lei, MD   Admit date - 01/15/2019   LOS - 1  No chief complaint on file.      Brief Narrative: Patient is a 48 y.o. female with PMHx of DM-2, asthma, fibromyalgia-who was hospitalized at [facility] from 10/7-10/13-for acute hypoxemic respiratory failure secondary to COVID-19 pneumonia.  Patient was diagnosed with COVID-19 on 10/6-this was the very same day she first started having symptoms.  Patient during her recent hospitalization was at one point on 8 L of oxygen-she was treated with steroids/Remdesivir-and titrated down to room air and discharged home in a stable manner.  She presented back to the hospital on 10/14 with worsening fatigue-was found to have worsening hypoxemia-started on IV steroids and [facility] back to [facility].  See below for further details.    Subjective:    Meredith Olson today remains very short of breath with minimal activity-she still is very fatigued.  She is now on 9 L of high flow oxygen.   Assessment  & Plan :   Acute Hypoxic Resp Failure due to Covid 19 Viral pneumonia: Very unusual case-appropriately treated with steroids and Remdesivir and discharged home on room air-to come back the very next day with worsening hypoxemia.  She has worsened overnight-and is on 9 L of high flow oxygen.  CTA chest done at [facility] for PE-confirms significant amount of infiltrates (personally reviewed).  CRP has started to rise-with her worsening hypoxemia-with patient just being 9 days out  from her diagnosis/onset of symptoms-worry that she may still be in the inflammatory stage of this disease-she obviously remains at risk for further worsening and life-threatening disease.  She remains on IV steroids.  I have had extensive discussion with the patient and spouse over the phone-various options including continue with IV steroids, adding convalescent plasma, adding off label Actemra were all discussed.  I went over the rationale, risks, benefits of both convalescent plasma and  Actemra (off label use).  Patient and spouse subsequently discussed amongst themselves-and I was informed by the nurse-that the patient would like to add both Actemra and plasma to her current therapy.  Fever: afebrile/  O2 requirements: On 9 l/m  COVID-19 Labs: Recent Labs    01/16/19 0440  DDIMER 0.43  FERRITIN 224  CRP 5.5*    No results found for: SARSCOV2NAA   COVID-19 Medications: Steroids:10/14>> Remdesivir:10/14>> Actemra:10/15 x 1 Convalescent Plasma:10/15 x1 Research Studies:N/A  Other medications: Diuretics:Euvolemic-but we will try Lasix x1 to maintain negative balance. Antibiotics:Not needed as no evidence of bacterial infection-recent procalcitonin on 10/7 was negative.  Prone/Incentive Spirometry: encouraged patient to lie prone for 3-4 hours at a time for a total of 16 hours a day, and to encourage incentive spirometry use 3-4/hour.  DVT Prophylaxis  :  Lovenox   DM-2 (A1c 7.2 on 10/8) with uncontrolled hyperglycemia: CBGs on the higher side as patient on steroids-we will add 15 units of Lantus-and follow.  CBG (last 3)  Recent Labs    01/15/19 2112 01/16/19 0743 01/16/19 1114  GLUCAP 291* 241* 246*    Bronchial asthma: No signs of exacerbation-stable-continue bronchodilators  Obesity: Estimated body mass index is 34.97 kg/m as calculated from the following:   Height as of this encounter:  (1.727 m).   Weight as of this encounter: 104.3 kg.   ABG: No results  found for: PHART, PCO2ART, PO2ART, HCO3, TCO2, ACIDBASEDEF, O2SAT  Vent Settings: N/A   Condition - Extremely Guarded-very tenuous with risk for further deterioration  Family Communication  :  Spouse updated over the phone  Code Status :  Full Code  Diet :  Diet Order            Diet 2 gram sodium Room service appropriate? Yes; Fluid consistency: Thin  Diet effective now               Disposition Plan  :  Remain hospitalized  Barriers to discharge:still hypoxic-need to wean O2 further  Consults  :  None  Procedures  :  None  Antibiotics  :    Anti-infectives (From admission, onward)   None      Inpatient Medications  Scheduled Meds:  enoxaparin (LOVENOX) injection  55 mg Subcutaneous Q24H   insulin aspart  0-15 Units Subcutaneous TID WC   insulin aspart  0-5 Units Subcutaneous QHS   Ipratropium-Albuterol  2 puff Inhalation Q6H   methylPREDNISolone (SOLU-MEDROL) injection  60 mg Intravenous Q12H   pneumococcal 23 valent vaccine  0.5 mL Intramuscular Tomorrow-1000   vitamin C  500 mg Oral Daily   zinc sulfate  220 mg Oral Daily   Continuous Infusions:  tocilizumab (ACTEMRA) IV 800 mg (01/16/19 1318)   PRN Meds:.acetaminophen, chlorpheniramine-HYDROcodone, guaiFENesin-dextromethorphan, ondansetron **OR** ondansetron (ZOFRAN) IV, polyethylene glycol   Time Spent in minutes  45  The patient is critically ill with multiple organ system failure and requires high complexity decision making for assessment and support, frequent evaluation and titration of therapies, advanced monitoring, review of radiographic studies and interpretation of complex data.   See all Orders from today for further details   Jeoffrey Massed M.D on 01/16/2019 at 1:32 PM  To page go to [email or web address] - use universal password  [facility] -  Office  [phone]    Objective:   Vitals:   01/15/19 2230 01/15/19 2300 01/16/19 0309 01/16/19 0744  BP:   103/68 108/70    Pulse: 69 62 69 (!) 59  Resp:  20 20 19  (!) 22  Temp:   98.1 F (36.7 C) 99.3 F (37.4 C)  TempSrc:   Oral Oral  SpO2: (!) 87% (!) 89% 92% 90%  Weight:      Height:        Wt Readings from Last 3 Encounters:  01/15/19 104.3 kg  01/08/19 110.9 kg  07/19/15 106.6 kg     Intake/Output Summary (Last 24 hours) at 01/16/2019 1332 Last data filed at 01/16/2019 0950 Gross per 24 hour  Intake 720 ml  Output --  Net 720 ml     Physical Exam Gen Exam:Alert awake-not in any distress HEENT:atraumatic, normocephalic Chest: B/L clear to auscultation anteriorly CVS:S1S2 regular Abdomen:soft non tender, non distended Extremities:no edema Neurology: Non focal Skin: no rash   Data Review:    CBC Recent Labs  Lab 01/10/19 0340 01/11/19 0149 01/12/19 0500 01/13/19 0129 01/16/19 0440  WBC 8.2 4.2 4.1 4.7 5.1  HGB 13.8 13.6 13.6 13.9 14.5  HCT 42.8 43.2 42.8 43.5 44.7  PLT 380 346 371 365 432*  MCV 90.3 90.2 89.4 89.3 87.6  MCH 29.1 28.4 28.4 28.5 28.4  MCHC 32.2 31.5 31.8 32.0 32.4  RDW 13.9 13.8 13.6 13.6 13.3  LYMPHSABS 1.6 1.3 1.6 1.4 1.0  MONOABS 0.6 0.5 0.4 0.4 0.3  EOSABS 0.0 0.0 0.0 0.0 0.0  BASOSABS 0.0 0.0 0.0 0.0 0.0    Chemistries  Recent Labs  Lab 01/10/19 0340 01/11/19 0149 01/12/19 0500 01/13/19 0129 01/16/19 0440  NA 135 137 137 137 136  K 3.5 4.2 4.2 4.3 3.8  CL 95* 98 100 100 101  CO2 28 27 27 26 24   GLUCOSE 102* 144* 148* 167* 251*  BUN 16 17 15 15  21*  CREATININE 0.85 0.84 0.80 0.79 0.78  CALCIUM 8.5* 8.4* 8.3* 8.5* 8.6*  MG 2.2 2.5* 2.3  --  2.5*  AST 47* 66* 50* 44* 43*  ALT 62* 69* 59* 53* 87*  ALKPHOS 75 70 67 69 64  BILITOT 0.3 0.5 0.3 0.4 0.3   ------------------------------------------------------------------------------------------------------------------ No results for input(s): CHOL, HDL, LDLCALC, TRIG, CHOLHDL, LDLDIRECT in the last 72 hours.  Lab Results  Component Value Date   HGBA1C 7.2 (H) 01/09/2019    ------------------------------------------------------------------------------------------------------------------ No results for input(s): TSH, T4TOTAL, T3FREE, THYROIDAB in the last 72 hours.  Invalid input(s): FREET3 ------------------------------------------------------------------------------------------------------------------ Recent Labs    01/16/19 0440  FERRITIN 224    Coagulation profile No results for input(s): INR, PROTIME in the last 168 hours.  Recent Labs    01/16/19 0440  DDIMER 0.43    Cardiac Enzymes No results for input(s): CKMB, TROPONINI, MYOGLOBIN in the last 168 hours.  Invalid input(s): CK ------------------------------------------------------------------------------------------------------------------    Component Value Date/Time   BNP 19.7 01/16/2019 0440    Micro Results No results found for this or any previous visit (from the past 240 hour(s)).  Radiology Reports Dg Chest 1 View  Result Date: 01/11/2019 CLINICAL DATA:  Coronavirus infection.  Shortness of breath. EXAM: CHEST  1 VIEW COMPARISON:  01/09/2019 FINDINGS: Worsening bilateral patchy pulmonary infiltrates and some atelectasis. No lobar consolidation or collapse. No effusions. Heart and mediastinal shadows remain normal. IMPRESSION: Slight worsening of patchy bilateral pulmonary infiltrates with mild linear atelectasis Electronically Signed   By: Paulina FusiMark  Shogry M.D.   On: 01/11/2019 10:20   Dg Chest Port 1 View  Result Date: 01/09/2019 CLINICAL DATA:  Pneumonia due to [place] EXAM: PORTABLE CHEST 1 VIEW COMPARISON:  Yesterday FINDINGS: Normal heart size and  mediastinal contours. No acute infiltrate or edema. No effusion or pneumothorax. No acute osseous findings. IMPRESSION: Negative chest. Electronically Signed   By: Monte Fantasia M.D.   On: 01/09/2019 06:59

## 2019-01-17 LAB — CBC WITH DIFFERENTIAL/PLATELET
Abs Immature Granulocytes: 0.11 10*3/uL — ABNORMAL HIGH (ref 0.00–0.07)
Basophils Absolute: 0 10*3/uL (ref 0.0–0.1)
Basophils Relative: 0 %
Eosinophils Absolute: 0 10*3/uL (ref 0.0–0.5)
Eosinophils Relative: 0 %
HCT: 41.7 % (ref 36.0–46.0)
Hemoglobin: 13.5 g/dL (ref 12.0–15.0)
Immature Granulocytes: 1 %
Lymphocytes Relative: 13 %
Lymphs Abs: 1.2 10*3/uL (ref 0.7–4.0)
MCH: 28.2 pg (ref 26.0–34.0)
MCHC: 32.4 g/dL (ref 30.0–36.0)
MCV: 87.2 fL (ref 80.0–100.0)
Monocytes Absolute: 0.7 10*3/uL (ref 0.1–1.0)
Monocytes Relative: 7 %
Neutro Abs: 6.9 10*3/uL (ref 1.7–7.7)
Neutrophils Relative %: 79 %
Platelets: 462 10*3/uL — ABNORMAL HIGH (ref 150–400)
RBC: 4.78 MIL/uL (ref 3.87–5.11)
RDW: 13.3 % (ref 11.5–15.5)
WBC: 8.9 10*3/uL (ref 4.0–10.5)
nRBC: 0 % (ref 0.0–0.2)

## 2019-01-17 LAB — COMPREHENSIVE METABOLIC PANEL
ALT: 62 U/L — ABNORMAL HIGH (ref 0–44)
AST: 27 U/L (ref 15–41)
Albumin: 3.4 g/dL — ABNORMAL LOW (ref 3.5–5.0)
Alkaline Phosphatase: 55 U/L (ref 38–126)
Anion gap: 10 (ref 5–15)
BUN: 26 mg/dL — ABNORMAL HIGH (ref 6–20)
CO2: 26 mmol/L (ref 22–32)
Calcium: 8.7 mg/dL — ABNORMAL LOW (ref 8.9–10.3)
Chloride: 100 mmol/L (ref 98–111)
Creatinine, Ser: 0.77 mg/dL (ref 0.44–1.00)
GFR calc Af Amer: >60 mL/min (ref >60)
GFR calc non Af Amer: >60 mL/min (ref >60)
Glucose, Bld: 227 mg/dL — ABNORMAL HIGH (ref 70–99)
Potassium: 3.9 mmol/L (ref 3.5–5.1)
Sodium: 136 mmol/L (ref 135–145)
Total Bilirubin: 0.4 mg/dL (ref 0.3–1.2)
Total Protein: 6.8 g/dL (ref 6.5–8.1)

## 2019-01-17 LAB — GLUCOSE, CAPILLARY
Glucose-Capillary: 193 mg/dL — ABNORMAL HIGH (ref 70–99)
Glucose-Capillary: 271 mg/dL — ABNORMAL HIGH (ref 70–99)
Glucose-Capillary: 221 mg/dL — ABNORMAL HIGH (ref 70–99)
Glucose-Capillary: 210 mg/dL — ABNORMAL HIGH (ref 70–99)

## 2019-01-17 LAB — C-REACTIVE PROTEIN: CRP: 1.4 mg/dL — ABNORMAL HIGH (ref <1.0)

## 2019-01-17 LAB — FERRITIN: Ferritin: 191 ng/mL (ref 11–307)

## 2019-01-17 LAB — HEPATITIS B SURFACE ANTIBODY, QUANTITATIVE: Hep B S AB Quant (Post): 13 m[IU]/mL (ref >9.9)

## 2019-01-17 LAB — D-DIMER, QUANTITATIVE: D-Dimer, Quant: 0.3 ug/mL-FEU (ref 0.00–0.50)

## 2019-01-17 LAB — MAGNESIUM: Magnesium: 2.6 mg/dL — ABNORMAL HIGH (ref 1.7–2.4)

## 2019-01-17 MED ORDER — FUROSEMIDE 10 MG/ML IJ SOLN
40.0000 mg | Freq: Once | INTRAMUSCULAR | Status: AC
  Administered 2019-01-17: 40 mg via INTRAVENOUS
  Filled 2019-01-17: qty 4

## 2019-01-17 MED ORDER — LORAZEPAM 2 MG/ML IJ SOLN
1.0000 mg | Freq: Once | INTRAMUSCULAR | Status: AC
  Administered 2019-01-17: 22:00:00 1 mg via INTRAVENOUS
  Filled 2019-01-17: qty 1

## 2019-01-17 NOTE — Progress Notes (Signed)
PROGRESS NOTE                                                                                                                                                                                                             Patient Demographics:    Meredith Olson, is a 48 y.o. female, DOB - 07-03-1970, PYP:[id number]  Outpatient Primary MD for the patient is Nicholos Johns, MD   Admit date - 01/15/2019   LOS - 2  No chief complaint on file.      Brief Narrative: Patient is a 48 y.o. female with PMHx of DM-2, asthma, fibromyalgia-who was hospitalized at [facility] from 10/7-10/13-for acute hypoxemic respiratory failure secondary to COVID-19 pneumonia.  Patient was diagnosed with COVID-19 on 10/6-this was the very same day she first started having symptoms.  Patient during her recent hospitalization was at one point on 8 L of oxygen-she was treated with steroids/Remdesivir-and titrated down to room air and discharged home in a stable manner.  She presented back to the hospital on 10/14 with worsening fatigue-was found to have worsening hypoxemia-started on IV steroids and admitted back to the [facility] at [facility].  She was subsequently also treated with Actemra on 10/15, and transfused 1 unit of convalescent plasma on 10/15.  See below for further details.    Subjective:    Meredith Olson thinks she feels a slightly better-she was on 9 L of high flow oxygen when I saw her this morning-while I was in the room-I was able to titrate her down to 7 L-her O2 saturation remained in the 90s throughout.   Assessment  & Plan :   Acute Hypoxic Resp Failure due to Covid 19 Viral pneumonia: Seems to have improved a bit compared to yesterday-she is definitely not worse.  Plan is to continue steroids-encourage prone positioning and incentive spirometry/flutter valve.  Her CRP is significantly decreased today  after she received Actemra yesterday.  We will give 1 dose of IV Lasix to maintain negative balance today.  Fever: afebrile/  O2 requirements: On 7 l/m  COVID-19 Labs: Recent Labs    01/16/19 0440 01/17/19 0400  DDIMER 0.43 0.30  FERRITIN 224 191  CRP 5.5* 1.4*    No results found for: SARSCOV2NAA   COVID-19 Medications: Steroids:10/14>> Remdesivir: Actemra:10/15 x 1 Convalescent Plasma:10/15 x1 Research  Studies:N/A  Other medications: Diuretics:Euvolemic-but we will try Lasix x1 to maintain negative balance. Antibiotics:Not needed as no evidence of bacterial infection-recent procalcitonin on 10/7 was negative.  Prone/Incentive Spirometry: encouraged patient to lie prone for 3-4 hours at a time for a total of 16 hours a day, and to encourage incentive spirometry use 3-4/hour.  DVT Prophylaxis  :  Lovenox   DM-2 (A1c 7.2 on 10/8) with uncontrolled hyperglycemia: CBGs relatively stable-just started on Lantus 15 units on 10/15-we will watch for another day before adjusting any further.   CBG (last 3)  Recent Labs    01/16/19 1600 01/16/19 2124 01/17/19 0808  GLUCAP 215* 293* 193*    Bronchial asthma: No signs of exacerbation-stable-continue bronchodilators  Obesity: Estimated body mass index is 34.97 kg/m as calculated from the following:   Height as of this encounter: 5\' 8"  (1.727 m).   Weight as of this encounter: 104.3 kg.   ABG: No results found for: PHART, PCO2ART, PO2ART, HCO3, TCO2, ACIDBASEDEF, O2SAT  Vent Settings: N/A   Condition - Extremely Guarded-very tenuous with risk for further deterioration  Family Communication  :  Spouse updated over the phone on 10/16  Code Status :  Full Code  Diet :  Diet Order            Diet 2 gram sodium Room service appropriate? Yes; Fluid consistency: Thin  Diet effective now               Disposition Plan  :  Remain hospitalized  Barriers to discharge:still hypoxic-need to wean O2 further  Consults   :  None  Procedures  :  None  Antibiotics  :    Anti-infectives (From admission, onward)   None      Inpatient Medications  Scheduled Meds: . DULoxetine  60 mg Oral Daily  . enoxaparin (LOVENOX) injection  55 mg Subcutaneous Q24H  . insulin aspart  0-15 Units Subcutaneous TID WC  . insulin aspart  0-5 Units Subcutaneous QHS  . insulin glargine  15 Units Subcutaneous QHS  . Ipratropium-Albuterol  2 puff Inhalation Q6H  . methylPREDNISolone (SOLU-MEDROL) injection  60 mg Intravenous Q12H  . pneumococcal 23 valent vaccine  0.5 mL Intramuscular Tomorrow-1000  . vitamin C  500 mg Oral Daily  . zinc sulfate  220 mg Oral Daily   Continuous Infusions:  PRN Meds:.acetaminophen, chlorpheniramine-HYDROcodone, guaiFENesin-dextromethorphan, ondansetron **OR** ondansetron (ZOFRAN) IV, oxymetazoline, polyethylene glycol   Time Spent in minutes  35  See all Orders from today for further details   11/16 M.D on 01/17/2019 at 11:25 AM  To page go to [email or web address] - use universal password  [facility] -  Office  [phone]    Objective:   Vitals:   01/17/19 0153 01/17/19 0200 01/17/19 0447 01/17/19 0700  BP: 112/66  99/70 115/68  Pulse: (!) 55  (!) 57   Resp: 15  12 15   Temp: 98.5 F (36.9 C)  99.3 F (37.4 C) (!) 97 F (36.1 C)  TempSrc: Oral  Oral Oral  SpO2: 93% 94% 91% 95%  Weight:      Height:        Wt Readings from Last 3 Encounters:  01/15/19 104.3 kg  01/08/19 110.9 kg  07/19/15 106.6 kg     Intake/Output Summary (Last 24 hours) at 01/17/2019 1125 Last data filed at 01/17/2019 0153 Gross per 24 hour  Intake 507.08 ml  Output 600 ml  Net -92.92 ml     Physical Exam Gen  Exam:Alert awake-not in any distress HEENT:atraumatic, normocephalic Chest: B/L clear to auscultation anteriorly CVS:S1S2 regular Abdomen:soft non tender, non distended Extremities:no edema Neurology: Non focal Skin: no rash   Data Review:    CBC Recent Labs   Lab 01/11/19 0149 01/12/19 0500 01/13/19 0129 01/16/19 0440 01/17/19 0400  WBC 4.2 4.1 4.7 5.1 8.9  HGB 13.6 13.6 13.9 14.5 13.5  HCT 43.2 42.8 43.5 44.7 41.7  PLT 346 371 365 432* 462*  MCV 90.2 89.4 89.3 87.6 87.2  MCH 28.4 28.4 28.5 28.4 28.2  MCHC 31.5 31.8 32.0 32.4 32.4  RDW 13.8 13.6 13.6 13.3 13.3  LYMPHSABS 1.3 1.6 1.4 1.0 1.2  MONOABS 0.5 0.4 0.4 0.3 0.7  EOSABS 0.0 0.0 0.0 0.0 0.0  BASOSABS 0.0 0.0 0.0 0.0 0.0    Chemistries  Recent Labs  Lab 01/11/19 0149 01/12/19 0500 01/13/19 0129 01/16/19 0440 01/17/19 0400  NA 137 137 137 136 136  K 4.2 4.2 4.3 3.8 3.9  CL 98 100 100 101 100  CO2 27 27 26 24 26   GLUCOSE 144* 148* 167* 251* 227*  BUN 17 15 15  21* 26*  CREATININE 0.84 0.80 0.79 0.78 0.77  CALCIUM 8.4* 8.3* 8.5* 8.6* 8.7*  MG 2.5* 2.3  --  2.5* 2.6*  AST 66* 50* 44* 43* 27  ALT 69* 59* 53* 87* 62*  ALKPHOS 70 67 69 64 55  BILITOT 0.5 0.3 0.4 0.3 0.4   ------------------------------------------------------------------------------------------------------------------ No results for input(s): CHOL, HDL, LDLCALC, TRIG, CHOLHDL, LDLDIRECT in the last 72 hours.  Lab Results  Component Value Date   HGBA1C 7.2 (H) 01/09/2019   ------------------------------------------------------------------------------------------------------------------ No results for input(s): TSH, T4TOTAL, T3FREE, THYROIDAB in the last 72 hours.  Invalid input(s): FREET3 ------------------------------------------------------------------------------------------------------------------ Recent Labs    01/16/19 0440 01/17/19 0400  FERRITIN 224 191    Coagulation profile No results for input(s): INR, PROTIME in the last 168 hours.  Recent Labs    01/16/19 0440 01/17/19 0400  DDIMER 0.43 0.30    Cardiac Enzymes No results for input(s): CKMB, TROPONINI, MYOGLOBIN in the last 168 hours.  Invalid input(s): CK  ------------------------------------------------------------------------------------------------------------------    Component Value Date/Time   BNP 19.7 01/16/2019 0440    Micro Results No results found for this or any previous visit (from the past 240 hour(s)).  Radiology Reports Dg Chest 1 View  Result Date: 01/11/2019 CLINICAL DATA:  Coronavirus infection.  Shortness of breath. EXAM: CHEST  1 VIEW COMPARISON:  01/09/2019 FINDINGS: Worsening bilateral patchy pulmonary infiltrates and some atelectasis. No lobar consolidation or collapse. No effusions. Heart and mediastinal shadows remain normal. IMPRESSION: Slight worsening of patchy bilateral pulmonary infiltrates with mild linear atelectasis Electronically Signed   By: Paulina FusiMark  Shogry M.D.   On: 01/11/2019 10:20   Dg Chest Port 1 View  Result Date: 01/09/2019 CLINICAL DATA:  Pneumonia due to [place] EXAM: PORTABLE CHEST 1 VIEW COMPARISON:  Yesterday FINDINGS: Normal heart size and mediastinal contours. No acute infiltrate or edema. No effusion or pneumothorax. No acute osseous findings. IMPRESSION: Negative chest. Electronically Signed   By: Marnee SpringJonathon  Watts M.D.   On: 01/09/2019 06:59

## 2019-01-17 NOTE — Plan of Care (Signed)
  Problem: Education: Goal: Knowledge of risk factors and measures for prevention of condition will improve Outcome: Progressing   Problem: Coping: Goal: Psychosocial and spiritual needs will be supported Outcome: Progressing   Problem: Respiratory: Goal: Will maintain a patent airway Outcome: Progressing Goal: Complications related to the disease process, condition or treatment will be avoided or minimized Outcome: Progressing   

## 2019-01-17 NOTE — Evaluation (Signed)
Physical Therapy Evaluation Patient Details Name: Meredith Olson MRN: [id number] DOB: 1971-03-31 Today's Date: 01/17/2019   History of Present Illness  48 y/o female pt admitted to [facility] [address] 10/7-10/13 w/ COVID. Readmitted 10/14 w/ fever, hypoxia and multifocal PNA. PMHx" anxiety, asthma, depression, fibromyalgia, GERD, migraines, dyspnea sec to asthma.  Clinical Impression   Pt re-admitted with above diagnosis.  Pt was recently d/c home with spouse, whom is also ill and self isolating hence was not able to assist pt with ADLs etc at home hence pt called EMS. Pt currently with functional limitations due to limitation in activity tolerance and maintaining 02 sats in therapeutic range. Pt will benefit from skilled PT to increase her overall strength, balance and coordination, activity tolerance, independence and safety with mobility to allow discharge to the venue listed below.   As pt has already had one unsuccessful d/c home therapist recommends post acute care level rehab prior to d/c home.     Follow Up Recommendations SNF(unsucessful d/c home, spouse is also sick )    Equipment Recommendations  None recommended by PT    Recommendations for Other Services OT consult     Precautions / Restrictions Precautions Precautions: Fall Restrictions Weight Bearing Restrictions: No      Mobility  Bed Mobility               General bed mobility comments: Pt received sitting EOB   Transfers Overall transfer level: Modified independent Equipment used: None             General transfer comment: Able to complete sit<>stand and also stand pivot to [facility] at bedside with mod I  Ambulation/Gait Ambulation/Gait assistance: Supervision Gait Distance (Feet): 38 Feet   Gait Pattern/deviations: Shuffle;Scissoring;Step-to pattern     General Gait Details: ambulates very very slowly and cautiously, no AD  Stairs            Wheelchair Mobility    Modified Rankin (Stroke Patients  Only)       Balance Overall balance assessment: Needs assistance Sitting-balance support: No upper extremity supported;Feet supported Sitting balance-Leahy Scale: Good       Standing balance-Leahy Scale: Fair                               Pertinent Vitals/Pain Pain Assessment: No/denies pain    Home Living                        Prior Function                 Hand Dominance        Extremity/Trunk Assessment                Communication      Cognition Arousal/Alertness: Awake/alert Behavior During Therapy: WFL for tasks assessed/performed Overall Cognitive Status: Within Functional Limits for tasks assessed                                        General Comments General comments (skin integrity, edema, etc.): Pt re-admitted after being d/c home for 1 day, states that when at home was not able to care for herself and spouse also has COVID and hence needed to quarantine from each other hence called EMS. This am pt does well with mobility but is limited by 02 saturation.  Pt able to ambulate approx 15ft with no AD and SBA w/ line management pt on 8L/min via HFNC and is noted to desat to min of 85%, pt needs cues for pursed lip breathing as does not always follow though, will take 1-2 breaths then goes back to breathing in/out through nose. also addressed breathing with IS, pt did fair with this needing continued cues for completion.    Exercises Other Exercises Other Exercises: educated on and initiated IS use, pt has some difficulty with using needing continued cues   Assessment/Plan    PT Assessment    PT Problem List         PT Treatment Interventions      PT Goals (Current goals can be found in the Care Plan section)  Acute Rehab PT Goals Patient Stated Goal: get better and go home    Frequency Min 3X/week   Barriers to discharge        Co-evaluation               AM-PAC PT "6 Clicks" Mobility   Outcome Measure Help needed turning from your back to your side while in a flat bed without using bedrails?: None Help needed moving from lying on your back to sitting on the side of a flat bed without using bedrails?: None Help needed moving to and from a bed to a chair (including a wheelchair)?: None Help needed standing up from a chair using your arms (e.g., wheelchair or bedside chair)?: None Help needed to walk in hospital room?: A Little Help needed climbing 3-5 steps with a railing? : A Lot 6 Click Score: 21    End of Session Equipment Utilized During Treatment: Oxygen Activity Tolerance: Treatment limited secondary to medical complications (Comment) Patient left: in chair;with call bell/phone within reach   PT Visit Diagnosis: Other abnormalities of gait and mobility (R26.89);Muscle weakness (generalized) (M62.81)    Time: 0814-4818 PT Time Calculation (min) (ACUTE ONLY): 40 min   Charges:   PT Evaluation $PT Eval Moderate Complexity: 1 Mod PT Treatments $Gait Training: 8-22 mins $Therapeutic Exercise: 8-22 mins        Horald Chestnut, PT   Delford Field 01/17/2019, 12:59 PM

## 2019-01-18 LAB — COMPREHENSIVE METABOLIC PANEL
ALT: 58 U/L — ABNORMAL HIGH (ref 0–44)
AST: 29 U/L (ref 15–41)
Albumin: 3.5 g/dL (ref 3.5–5.0)
Alkaline Phosphatase: 58 U/L (ref 38–126)
Anion gap: 10 (ref 5–15)
BUN: 31 mg/dL — ABNORMAL HIGH (ref 6–20)
CO2: 31 mmol/L (ref 22–32)
Calcium: 8.9 mg/dL (ref 8.9–10.3)
Chloride: 100 mmol/L (ref 98–111)
Creatinine, Ser: 0.86 mg/dL (ref 0.44–1.00)
GFR calc Af Amer: >60 mL/min (ref >60)
GFR calc non Af Amer: >60 mL/min (ref >60)
Glucose, Bld: 224 mg/dL — ABNORMAL HIGH (ref 70–99)
Potassium: 4.5 mmol/L (ref 3.5–5.1)
Sodium: 141 mmol/L (ref 135–145)
Total Bilirubin: 0.9 mg/dL (ref 0.3–1.2)
Total Protein: 7.2 g/dL (ref 6.5–8.1)

## 2019-01-18 LAB — PREPARE FRESH FROZEN PLASMA: Unit division: 0

## 2019-01-18 LAB — CBC WITH DIFFERENTIAL/PLATELET
Abs Immature Granulocytes: 0.07 10*3/uL (ref 0.00–0.07)
Basophils Absolute: 0 10*3/uL (ref 0.0–0.1)
Basophils Relative: 0 %
Eosinophils Absolute: 0 10*3/uL (ref 0.0–0.5)
Eosinophils Relative: 0 %
HCT: 44.5 % (ref 36.0–46.0)
Hemoglobin: 14.2 g/dL (ref 12.0–15.0)
Immature Granulocytes: 1 %
Lymphocytes Relative: 12 %
Lymphs Abs: 1.4 10*3/uL (ref 0.7–4.0)
MCH: 28.5 pg (ref 26.0–34.0)
MCHC: 31.9 g/dL (ref 30.0–36.0)
MCV: 89.4 fL (ref 80.0–100.0)
Monocytes Absolute: 0.5 10*3/uL (ref 0.1–1.0)
Monocytes Relative: 4 %
Neutro Abs: 10 10*3/uL — ABNORMAL HIGH (ref 1.7–7.7)
Neutrophils Relative %: 83 %
Platelets: 510 10*3/uL — ABNORMAL HIGH (ref 150–400)
RBC: 4.98 MIL/uL (ref 3.87–5.11)
RDW: 13.6 % (ref 11.5–15.5)
WBC: 11.9 10*3/uL — ABNORMAL HIGH (ref 4.0–10.5)
nRBC: 0 % (ref 0.0–0.2)

## 2019-01-18 LAB — BPAM FFP
Blood Product Expiration Date: 202010162055
ISSUE DATE / TIME: 202010152131
Unit Type and Rh: 5100

## 2019-01-18 LAB — FERRITIN: Ferritin: 187 ng/mL (ref 11–307)

## 2019-01-18 LAB — C-REACTIVE PROTEIN: CRP: <0.8 mg/dL (ref <1.0)

## 2019-01-18 LAB — GLUCOSE, CAPILLARY
Glucose-Capillary: 219 mg/dL — ABNORMAL HIGH (ref 70–99)
Glucose-Capillary: 303 mg/dL — ABNORMAL HIGH (ref 70–99)
Glucose-Capillary: 302 mg/dL — ABNORMAL HIGH (ref 70–99)
Glucose-Capillary: 244 mg/dL — ABNORMAL HIGH (ref 70–99)

## 2019-01-18 LAB — D-DIMER, QUANTITATIVE: D-Dimer, Quant: 0.34 ug/mL-FEU (ref 0.00–0.50)

## 2019-01-18 LAB — MAGNESIUM: Magnesium: 2.9 mg/dL — ABNORMAL HIGH (ref 1.7–2.4)

## 2019-01-18 MED ORDER — FUROSEMIDE 10 MG/ML IJ SOLN
40.0000 mg | Freq: Once | INTRAMUSCULAR | Status: AC
  Administered 2019-01-18: 40 mg via INTRAVENOUS
  Filled 2019-01-18: qty 4

## 2019-01-18 MED ORDER — INSULIN GLARGINE 100 UNIT/ML ~~LOC~~ SOLN
22.0000 [IU] | Freq: Every day | SUBCUTANEOUS | Status: DC
  Administered 2019-01-18: 22 [IU] via SUBCUTANEOUS
  Filled 2019-01-18: qty 0.22

## 2019-01-18 MED ORDER — INSULIN ASPART 100 UNIT/ML ~~LOC~~ SOLN
0.0000 [IU] | Freq: Three times a day (TID) | SUBCUTANEOUS | Status: DC
  Administered 2019-01-18 (×2): 15 [IU] via SUBCUTANEOUS
  Administered 2019-01-19: 20 [IU] via SUBCUTANEOUS
  Administered 2019-01-19: 7 [IU] via SUBCUTANEOUS
  Administered 2019-01-19: 11 [IU] via SUBCUTANEOUS
  Administered 2019-01-20: 4 [IU] via SUBCUTANEOUS
  Administered 2019-01-20: 15 [IU] via SUBCUTANEOUS
  Administered 2019-01-20: 7 [IU] via SUBCUTANEOUS

## 2019-01-18 NOTE — Plan of Care (Signed)
Pt slept well during the night. No complaints of pain verbalized. Complained of feeling nervous and anxious last night saying she had a 'rough' day emotionally but pt did not want to elaborate. Reassurance given, 1 dose Ativan given. Alert and oriented. Vitals stable O2 support weaned to 5L HFNC for now, sats maintained at 92-93%. Dry cough noted, prn Robitussin given, IS exercises done. Independent with  ADLs and transfers to the [facility] for toileting needs. Skin assessed, intact.  IV SL. Able to regularly reposition for pressure relief. Tolerated prone positioning for short periods overnight. No other issues, will monitor.   Problem: Education: Goal: Knowledge of risk factors and measures for prevention of condition will improve Outcome: Progressing   Problem: Coping: Goal: Psychosocial and spiritual needs will be supported Outcome: Progressing   Problem: Respiratory: Goal: Will maintain a patent airway Outcome: Progressing Goal: Complications related to the disease process, condition or treatment will be avoided or minimized Outcome: Progressing

## 2019-01-18 NOTE — Progress Notes (Signed)
PROGRESS NOTE                                                                                                                                                                                                             Patient Demographics:    Meredith Olson, is a 48 y.o. female, DOB - 06-Jul-1970, URK:[id number]  Outpatient Primary MD for the patient is Nicholos Johns, MD   Admit date - 01/15/2019   LOS - 3  No chief complaint on file.      Brief Narrative: Patient is a 48 y.o. female with PMHx of DM-2, asthma, fibromyalgia-who was hospitalized at [facility] from 10/7-10/13-for acute hypoxemic respiratory failure secondary to COVID-19 pneumonia.  Patient was diagnosed with COVID-19 on 10/6-this was the very same day she first started having symptoms.  Patient during her recent hospitalization was at one point on 8 L of oxygen-she was treated with steroids/Remdesivir-and titrated down to room air and discharged home in a stable manner.  She presented back to the hospital on 10/14 with worsening fatigue-was found to have worsening hypoxemia-started on IV steroids and [facility].  She was subsequently also treated with Actemra on 10/15, and transfused 1 unit of convalescent plasma on 10/15.  See below for further details.    Subjective:    Meredith Olson feels better-she has less shortness of breath when she moves around.  Has been titrated down to 5 L.   Assessment  & Plan :   Acute Hypoxic Resp Failure due to Covid 19 Viral pneumonia: Improved-titrated down to 5 L.  Continue steroids.  Continue to encourage incentive spirometry, flutter valve.  Repeat IV Lasix x1 again today.    Fever: afebrile/  O2 requirements: On 5 l/m  COVID-19 Labs: Recent Labs    01/16/19 0440 01/17/19 0400 01/18/19 0509  DDIMER 0.43 0.30 0.34  FERRITIN 224 191 187  CRP 5.5*  1.4* <0.8    No results found for: SARSCOV2NAA   COVID-19 Medications: Steroids:10/14>> Remdesivir: Not given this admission-she completed a 5-day course last admission Actemra:10/15 x 1 Convalescent Plasma:10/15 x1 Research Studies:N/A  Other medications: Diuretics:Euvolemic-Lasix x1 again today to maintain negative balance.   Antibiotics:Not needed as no evidence of bacterial infection-recent procalcitonin on 10/7 was negative.  Prone/Incentive Spirometry: encouraged patient to  lie prone for 3-4 hours at a time for a total of 16 hours a day, and to encourage incentive spirometry use 3-4/hour.  DVT Prophylaxis  :  Lovenox   DM-2 (A1c 7.2 on 10/8) with uncontrolled hyperglycemia: CBGs remain elevated-increase Lantus to 22 units change SSI to resistant scale.  Follow.  .   CBG (last 3)  Recent Labs    01/17/19 1601 01/17/19 2025 01/18/19 0741  GLUCAP 221* 210* 219*    Bronchial asthma: No signs of exacerbation-stable-continue bronchodilators  Obesity: Estimated body mass index is 34.97 kg/m as calculated from the following:   Height as of this encounter: 5\' 8"  (1.727 m).   Weight as of this encounter: 104.3 kg.   ABG: No results found for: PHART, PCO2ART, PO2ART, HCO3, TCO2, ACIDBASEDEF, O2SAT  Vent Settings: N/A   Condition -stable  Family Communication  : Left a voicemail for spouse on 10/17.  Code Status :  Full Code  Diet :  Diet Order            Diet 2 gram sodium Room service appropriate? Yes; Fluid consistency: Thin  Diet effective now               Disposition Plan  :  Remain hospitalized  Barriers to discharge:still hypoxic-need to wean O2 further  Consults  :  None  Procedures  :  None  Antibiotics  :    Anti-infectives (From admission, onward)   None      Inpatient Medications  Scheduled Meds: . DULoxetine  60 mg Oral Daily  . enoxaparin (LOVENOX) injection  55 mg Subcutaneous Q24H  . insulin aspart  0-15 Units Subcutaneous TID  WC  . insulin aspart  0-5 Units Subcutaneous QHS  . insulin glargine  15 Units Subcutaneous QHS  . Ipratropium-Albuterol  2 puff Inhalation Q6H  . methylPREDNISolone (SOLU-MEDROL) injection  60 mg Intravenous Q12H  . pneumococcal 23 valent vaccine  0.5 mL Intramuscular Tomorrow-1000  . vitamin C  500 mg Oral Daily  . zinc sulfate  220 mg Oral Daily   Continuous Infusions:  PRN Meds:.acetaminophen, chlorpheniramine-HYDROcodone, guaiFENesin-dextromethorphan, ondansetron **OR** ondansetron (ZOFRAN) IV, oxymetazoline, polyethylene glycol   Time Spent in minutes  25  See all Orders from today for further details   Jeoffrey MassedShanker Ghimire M.D on 01/18/2019 at 11:12 AM  To page go to [email or web address] - use universal password  [facility] -  Office  [phone]    Objective:   Vitals:   01/17/19 1500 01/17/19 1948 01/18/19 0425 01/18/19 0817  BP: 120/73 (!) 104/54 103/67 112/70  Pulse: 71 60 (!) 58 63  Resp: 13 15 19 20   Temp: 97.9 F (36.6 C) 99.2 F (37.3 C) 98.4 F (36.9 C)   TempSrc: Oral Oral Oral   SpO2: 92% 92% 96% 92%  Weight:      Height:        Wt Readings from Last 3 Encounters:  01/15/19 104.3 kg  01/08/19 110.9 kg  07/19/15 106.6 kg     Intake/Output Summary (Last 24 hours) at 01/18/2019 1112 Last data filed at 01/18/2019 0400 Gross per 24 hour  Intake 240 ml  Output 350 ml  Net -110 ml     Physical Exam Gen Exam:Alert awake-not in any distress HEENT:atraumatic, normocephalic Chest: B/L clear to auscultation anteriorly CVS:S1S2 regular Abdomen:soft non tender, non distended Extremities:no edema Neurology: Non focal Skin: no rash   Data Review:    CBC Recent Labs  Lab 01/12/19 0500 01/13/19 0129 01/16/19 0440  01/17/19 0400 01/18/19 0509  WBC 4.1 4.7 5.1 8.9 11.9*  HGB 13.6 13.9 14.5 13.5 14.2  HCT 42.8 43.5 44.7 41.7 44.5  PLT 371 365 432* 462* 510*  MCV 89.4 89.3 87.6 87.2 89.4  MCH 28.4 28.5 28.4 28.2 28.5  MCHC 31.8 32.0 32.4  32.4 31.9  RDW 13.6 13.6 13.3 13.3 13.6  LYMPHSABS 1.6 1.4 1.0 1.2 1.4  MONOABS 0.4 0.4 0.3 0.7 0.5  EOSABS 0.0 0.0 0.0 0.0 0.0  BASOSABS 0.0 0.0 0.0 0.0 0.0    Chemistries  Recent Labs  Lab 01/12/19 0500 01/13/19 0129 01/16/19 0440 01/17/19 0400 01/18/19 0509  NA 137 137 136 136 141  K 4.2 4.3 3.8 3.9 4.5  CL 100 100 101 100 100  CO2 27 26 24 26 31   GLUCOSE 148* 167* 251* 227* 224*  BUN 15 15 21* 26* 31*  CREATININE 0.80 0.79 0.78 0.77 0.86  CALCIUM 8.3* 8.5* 8.6* 8.7* 8.9  MG 2.3  --  2.5* 2.6* 2.9*  AST 50* 44* 43* 27 29  ALT 59* 53* 87* 62* 58*  ALKPHOS 67 69 64 55 58  BILITOT 0.3 0.4 0.3 0.4 0.9   ------------------------------------------------------------------------------------------------------------------ No results for input(s): CHOL, HDL, LDLCALC, TRIG, CHOLHDL, LDLDIRECT in the last 72 hours.  Lab Results  Component Value Date   HGBA1C 7.2 (H) 01/09/2019   ------------------------------------------------------------------------------------------------------------------ No results for input(s): TSH, T4TOTAL, T3FREE, THYROIDAB in the last 72 hours.  Invalid input(s): FREET3 ------------------------------------------------------------------------------------------------------------------ Recent Labs    01/17/19 0400 01/18/19 0509  FERRITIN 191 187    Coagulation profile No results for input(s): INR, PROTIME in the last 168 hours.  Recent Labs    01/17/19 0400 01/18/19 0509  DDIMER 0.30 0.34    Cardiac Enzymes No results for input(s): CKMB, TROPONINI, MYOGLOBIN in the last 168 hours.  Invalid input(s): CK ------------------------------------------------------------------------------------------------------------------    Component Value Date/Time   BNP 19.7 01/16/2019 0440    Micro Results No results found for this or any previous visit (from the past 240 hour(s)).  Radiology Reports Dg Chest 1 View  Result Date: 01/11/2019 CLINICAL  DATA:  Coronavirus infection.  Shortness of breath. EXAM: CHEST  1 VIEW COMPARISON:  01/09/2019 FINDINGS: Worsening bilateral patchy pulmonary infiltrates and some atelectasis. No lobar consolidation or collapse. No effusions. Heart and mediastinal shadows remain normal. IMPRESSION: Slight worsening of patchy bilateral pulmonary infiltrates with mild linear atelectasis Electronically Signed   By: 03/11/2019 M.D.   On: 01/11/2019 10:20   Dg Chest Port 1 View  Result Date: 01/09/2019 CLINICAL DATA:  Pneumonia due to [place] EXAM: PORTABLE CHEST 1 VIEW COMPARISON:  Yesterday FINDINGS: Normal heart size and mediastinal contours. No acute infiltrate or edema. No effusion or pneumothorax. No acute osseous findings. IMPRESSION: Negative chest. Electronically Signed   By: 03/11/2019 M.D.   On: 01/09/2019 06:59

## 2019-01-18 NOTE — Progress Notes (Signed)
Patient deferred family update phone call. Eathan Groman, RN 

## 2019-01-19 LAB — CBC WITH DIFFERENTIAL/PLATELET
Abs Immature Granulocytes: 0.06 10*3/uL (ref 0.00–0.07)
Basophils Absolute: 0 10*3/uL (ref 0.0–0.1)
Basophils Relative: 0 %
Eosinophils Absolute: 0 10*3/uL (ref 0.0–0.5)
Eosinophils Relative: 0 %
HCT: 43.5 % (ref 36.0–46.0)
Hemoglobin: 14.3 g/dL (ref 12.0–15.0)
Immature Granulocytes: 1 %
Lymphocytes Relative: 13 %
Lymphs Abs: 1.3 10*3/uL (ref 0.7–4.0)
MCH: 28.6 pg (ref 26.0–34.0)
MCHC: 32.9 g/dL (ref 30.0–36.0)
MCV: 87 fL (ref 80.0–100.0)
Monocytes Absolute: 0.4 10*3/uL (ref 0.1–1.0)
Monocytes Relative: 4 %
Neutro Abs: 8 10*3/uL — ABNORMAL HIGH (ref 1.7–7.7)
Neutrophils Relative %: 82 %
Platelets: 510 10*3/uL — ABNORMAL HIGH (ref 150–400)
RBC: 5 MIL/uL (ref 3.87–5.11)
RDW: 13.2 % (ref 11.5–15.5)
WBC: 9.7 10*3/uL (ref 4.0–10.5)
nRBC: 0 % (ref 0.0–0.2)

## 2019-01-19 LAB — COMPREHENSIVE METABOLIC PANEL
ALT: 63 U/L — ABNORMAL HIGH (ref 0–44)
AST: 31 U/L (ref 15–41)
Albumin: 3.6 g/dL (ref 3.5–5.0)
Alkaline Phosphatase: 58 U/L (ref 38–126)
Anion gap: 11 (ref 5–15)
BUN: 32 mg/dL — ABNORMAL HIGH (ref 6–20)
CO2: 26 mmol/L (ref 22–32)
Calcium: 8.8 mg/dL — ABNORMAL LOW (ref 8.9–10.3)
Chloride: 99 mmol/L (ref 98–111)
Creatinine, Ser: 0.83 mg/dL (ref 0.44–1.00)
GFR calc Af Amer: >60 mL/min (ref >60)
GFR calc non Af Amer: >60 mL/min (ref >60)
Glucose, Bld: 233 mg/dL — ABNORMAL HIGH (ref 70–99)
Potassium: 4 mmol/L (ref 3.5–5.1)
Sodium: 136 mmol/L (ref 135–145)
Total Bilirubin: 0.5 mg/dL (ref 0.3–1.2)
Total Protein: 6.9 g/dL (ref 6.5–8.1)

## 2019-01-19 LAB — GLUCOSE, CAPILLARY
Glucose-Capillary: 253 mg/dL — ABNORMAL HIGH (ref 70–99)
Glucose-Capillary: 351 mg/dL — ABNORMAL HIGH (ref 70–99)
Glucose-Capillary: 215 mg/dL — ABNORMAL HIGH (ref 70–99)
Glucose-Capillary: 241 mg/dL — ABNORMAL HIGH (ref 70–99)

## 2019-01-19 LAB — D-DIMER, QUANTITATIVE: D-Dimer, Quant: 0.35 ug/mL-FEU (ref 0.00–0.50)

## 2019-01-19 LAB — FERRITIN: Ferritin: 185 ng/mL (ref 11–307)

## 2019-01-19 LAB — MAGNESIUM: Magnesium: 2.5 mg/dL — ABNORMAL HIGH (ref 1.7–2.4)

## 2019-01-19 LAB — C-REACTIVE PROTEIN: CRP: <0.8 mg/dL (ref <1.0)

## 2019-01-19 MED ORDER — INSULIN GLARGINE 100 UNIT/ML ~~LOC~~ SOLN
30.0000 [IU] | Freq: Every day | SUBCUTANEOUS | Status: DC
  Administered 2019-01-19 – 2019-01-20 (×2): 30 [IU] via SUBCUTANEOUS
  Filled 2019-01-19 (×2): qty 0.3

## 2019-01-19 MED ORDER — INSULIN ASPART 100 UNIT/ML ~~LOC~~ SOLN
4.0000 [IU] | Freq: Three times a day (TID) | SUBCUTANEOUS | Status: DC
  Administered 2019-01-19 – 2019-01-22 (×8): 4 [IU] via SUBCUTANEOUS

## 2019-01-19 MED ORDER — FUROSEMIDE 10 MG/ML IJ SOLN
40.0000 mg | Freq: Once | INTRAMUSCULAR | Status: AC
  Administered 2019-01-19: 40 mg via INTRAVENOUS
  Filled 2019-01-19: qty 4

## 2019-01-19 MED ORDER — METHYLPREDNISOLONE SODIUM SUCC 40 MG IJ SOLR
40.0000 mg | Freq: Two times a day (BID) | INTRAMUSCULAR | Status: DC
  Administered 2019-01-19 – 2019-01-20 (×2): 40 mg via INTRAVENOUS
  Filled 2019-01-19 (×2): qty 1

## 2019-01-19 NOTE — Progress Notes (Signed)
Patient deferred family update phone call. 

## 2019-01-19 NOTE — Progress Notes (Signed)
PROGRESS NOTE                                                                                                                                                                                                             Patient Demographics:    Meredith Olson, is a 48 y.o. female, DOB - 05-28-1970, WER:[id number]  Outpatient Primary MD for the patient is Nicholos Johns, MD   Admit date - 01/15/2019   LOS - 4  No chief complaint on file.      Brief Narrative: Patient is a 48 y.o. female with PMHx of DM-2, asthma, fibromyalgia-who was hospitalized at [facility] from 10/7-10/13-for acute hypoxemic respiratory failure secondary to COVID-19 pneumonia.  Patient was diagnosed with COVID-19 on 10/6-this was the very same day she first started having symptoms.  Patient during her recent hospitalization was at one point on 8 L of oxygen-she was treated with steroids/Remdesivir-and titrated down to room air and discharged home in a stable manner.  She presented back to the hospital on 10/14 with worsening fatigue-was found to have worsening hypoxemia-started on IV steroids and admitted [facility] to [facility].  She was subsequently also treated with Actemra on 10/15, and transfused 1 unit of convalescent plasma on 10/15.  See below for further details.    Subjective:    Meredith Olson continues to improve-she is down to around 4 L of oxygen this morning.  She claims she was able to move around with significant less shortness of breath yesterday.   Assessment  & Plan :   Acute Hypoxic Resp Failure due to Covid 19 Viral pneumonia: Continues to slowly improve-down to 4 L of oxygen this morning.  Will start tapering steroids today-continue to encourage incentive spirometry, flutter valve.    Fever: afebrile  O2 requirements: On 4 l/m  COVID-19 Labs: Recent Labs    01/17/19 0400 01/18/19  0509 01/19/19 0530  DDIMER 0.30 0.34 0.35  FERRITIN 191 187 185  CRP 1.4* <0.8 <0.8    No results found for: SARSCOV2NAA   COVID-19 Medications: Steroids:10/14>> Remdesivir: Not given this admission-she completed a 5-day course last admission Actemra:10/15 x 1 Convalescent Plasma:10/15 x1 Research Studies:N/A  Other medications: Diuretics:Euvolemic-repeat Lasix x1 again today to maintain negative balance. Antibiotics:Not needed as no evidence of bacterial infection-recent procalcitonin on  10/7 was negative.  Prone/Incentive Spirometry: encouraged patient to lie prone for 3-4 hours at a time for a total of 16 hours a day, and to encourage incentive spirometry use 3-4/hour.  DVT Prophylaxis  :  Lovenox   DM-2 (A1c 7.2 on 10/8) with uncontrolled hyperglycemia: CBGs remain elevated-increase Lantus to 30 units, add 4 units of NovoLog with meals-continue resistant SSI.  Follow.  Suspect as steroids get tapered down further-CBG should improve.    CBG (last 3)  Recent Labs    01/18/19 1612 01/18/19 2315 01/19/19 0758  GLUCAP 302* 244* 253*    Bronchial asthma: No signs of exacerbation-stable-continue bronchodilators  Obesity: Estimated body mass index is 34.97 kg/m as calculated from the following:   Height as of this encounter: 5\' 8"  (1.727 m).   Weight as of this encounter: 104.3 kg.   ABG: No results found for: PHART, PCO2ART, PO2ART, HCO3, TCO2, ACIDBASEDEF, O2SAT  Vent Settings: N/A   Condition -stable  Family Communication  : Spoke with spouse on 10/18  Code Status :  Full Code  Diet :  Diet Order            Diet 2 gram sodium Room service appropriate? Yes; Fluid consistency: Thin  Diet effective now               Disposition Plan  :  Remain hospitalized  Barriers to discharge:still hypoxic-need to wean O2 further  Consults  :  None  Procedures  :  None  Antibiotics  :    Anti-infectives (From admission, onward)   None      Inpatient  Medications  Scheduled Meds: . DULoxetine  60 mg Oral Daily  . enoxaparin (LOVENOX) injection  55 mg Subcutaneous Q24H  . insulin aspart  0-20 Units Subcutaneous TID WC  . insulin aspart  0-5 Units Subcutaneous QHS  . insulin glargine  22 Units Subcutaneous QHS  . Ipratropium-Albuterol  2 puff Inhalation Q6H  . methylPREDNISolone (SOLU-MEDROL) injection  60 mg Intravenous Q12H  . pneumococcal 23 valent vaccine  0.5 mL Intramuscular Tomorrow-1000  . vitamin C  500 mg Oral Daily  . zinc sulfate  220 mg Oral Daily   Continuous Infusions:  PRN Meds:.acetaminophen, chlorpheniramine-HYDROcodone, guaiFENesin-dextromethorphan, ondansetron **OR** ondansetron (ZOFRAN) IV, oxymetazoline, polyethylene glycol   Time Spent in minutes  25  See all Orders from today for further details   Jeoffrey MassedShanker Shinita Mac M.D on 01/19/2019 at 9:30 AM  To page go to [email or web address] - use universal password  [facility] -  Office  [phone]    Objective:   Vitals:   01/18/19 1600 01/18/19 2123 01/19/19 0453 01/19/19 0800  BP: 113/73 111/65 108/69 112/70  Pulse: 77  77 64  Resp: 15  (!) 22 13  Temp: 99 F (37.2 C) 99 F (37.2 C) 98.5 F (36.9 C) 98.6 F (37 C)  TempSrc: Oral Oral Oral Oral  SpO2: 91%  93% 91%  Weight:      Height:        Wt Readings from Last 3 Encounters:  01/15/19 104.3 kg  01/08/19 110.9 kg  07/19/15 106.6 kg    No intake or output data in the 24 hours ending 01/19/19 0930   Physical Exam Gen Exam:Alert awake-not in any distress HEENT:atraumatic, normocephalic Chest: B/L clear to auscultation anteriorly CVS:S1S2 regular Abdomen:soft non tender, non distended Extremities:no edema Neurology: Non focal Skin: no rash   Data Review:    CBC Recent Labs  Lab 01/13/19 0129 01/16/19 0440 01/17/19  0400 01/18/19 0509 01/19/19 0530  WBC 4.7 5.1 8.9 11.9* 9.7  HGB 13.9 14.5 13.5 14.2 14.3  HCT 43.5 44.7 41.7 44.5 43.5  PLT 365 432* 462* 510* 510*  MCV 89.3  87.6 87.2 89.4 87.0  MCH 28.5 28.4 28.2 28.5 28.6  MCHC 32.0 32.4 32.4 31.9 32.9  RDW 13.6 13.3 13.3 13.6 13.2  LYMPHSABS 1.4 1.0 1.2 1.4 1.3  MONOABS 0.4 0.3 0.7 0.5 0.4  EOSABS 0.0 0.0 0.0 0.0 0.0  BASOSABS 0.0 0.0 0.0 0.0 0.0    Chemistries  Recent Labs  Lab 01/13/19 0129 01/16/19 0440 01/17/19 0400 01/18/19 0509 01/19/19 0530  NA 137 136 136 141 136  K 4.3 3.8 3.9 4.5 4.0  CL 100 101 100 100 99  CO2 26 24 26 31 26   GLUCOSE 167* 251* 227* 224* 233*  BUN 15 21* 26* 31* 32*  CREATININE 0.79 0.78 0.77 0.86 0.83  CALCIUM 8.5* 8.6* 8.7* 8.9 8.8*  MG  --  2.5* 2.6* 2.9* 2.5*  AST 44* 43* 27 29 31   ALT 53* 87* 62* 58* 63*  ALKPHOS 69 64 55 58 58  BILITOT 0.4 0.3 0.4 0.9 0.5   ------------------------------------------------------------------------------------------------------------------ No results for input(s): CHOL, HDL, LDLCALC, TRIG, CHOLHDL, LDLDIRECT in the last 72 hours.  Lab Results  Component Value Date   HGBA1C 7.2 (H) 01/09/2019   ------------------------------------------------------------------------------------------------------------------ No results for input(s): TSH, T4TOTAL, T3FREE, THYROIDAB in the last 72 hours.  Invalid input(s): FREET3 ------------------------------------------------------------------------------------------------------------------ Recent Labs    01/18/19 0509 01/19/19 0530  FERRITIN 187 185    Coagulation profile No results for input(s): INR, PROTIME in the last 168 hours.  Recent Labs    01/18/19 0509 01/19/19 0530  DDIMER 0.34 0.35    Cardiac Enzymes No results for input(s): CKMB, TROPONINI, MYOGLOBIN in the last 168 hours.  Invalid input(s): CK ------------------------------------------------------------------------------------------------------------------    Component Value Date/Time   BNP 19.7 01/16/2019 0440    Micro Results No results found for this or any previous visit (from the past 240 hour(s)).   Radiology Reports Dg Chest 1 View  Result Date: 01/11/2019 CLINICAL DATA:  Coronavirus infection.  Shortness of breath. EXAM: CHEST  1 VIEW COMPARISON:  01/09/2019 FINDINGS: Worsening bilateral patchy pulmonary infiltrates and some atelectasis. No lobar consolidation or collapse. No effusions. Heart and mediastinal shadows remain normal. IMPRESSION: Slight worsening of patchy bilateral pulmonary infiltrates with mild linear atelectasis Electronically Signed   By: 03/13/2019 M.D.   On: 01/11/2019 10:20   Dg Chest Port 1 View  Result Date: 01/09/2019 CLINICAL DATA:  Pneumonia due to [place] EXAM: PORTABLE CHEST 1 VIEW COMPARISON:  Yesterday FINDINGS: Normal heart size and mediastinal contours. No acute infiltrate or edema. No effusion or pneumothorax. No acute osseous findings. IMPRESSION: Negative chest. Electronically Signed   By: 03/13/2019 M.D.   On: 01/09/2019 06:59

## 2019-01-20 LAB — CBC WITH DIFFERENTIAL/PLATELET
Abs Immature Granulocytes: 0.04 10*3/uL (ref 0.00–0.07)
Basophils Absolute: 0 10*3/uL (ref 0.0–0.1)
Basophils Relative: 0 %
Eosinophils Absolute: 0 10*3/uL (ref 0.0–0.5)
Eosinophils Relative: 0 %
HCT: 45 % (ref 36.0–46.0)
Hemoglobin: 14.7 g/dL (ref 12.0–15.0)
Immature Granulocytes: 1 %
Lymphocytes Relative: 15 %
Lymphs Abs: 1.2 10*3/uL (ref 0.7–4.0)
MCH: 28.4 pg (ref 26.0–34.0)
MCHC: 32.7 g/dL (ref 30.0–36.0)
MCV: 86.9 fL (ref 80.0–100.0)
Monocytes Absolute: 0.4 10*3/uL (ref 0.1–1.0)
Monocytes Relative: 5 %
Neutro Abs: 6.6 10*3/uL (ref 1.7–7.7)
Neutrophils Relative %: 79 %
Platelets: 470 10*3/uL — ABNORMAL HIGH (ref 150–400)
RBC: 5.18 MIL/uL — ABNORMAL HIGH (ref 3.87–5.11)
RDW: 13.1 % (ref 11.5–15.5)
WBC: 8.2 10*3/uL (ref 4.0–10.5)
nRBC: 0 % (ref 0.0–0.2)

## 2019-01-20 LAB — GLUCOSE, CAPILLARY
Glucose-Capillary: 200 mg/dL — ABNORMAL HIGH (ref 70–99)
Glucose-Capillary: 321 mg/dL — ABNORMAL HIGH (ref 70–99)
Glucose-Capillary: 219 mg/dL — ABNORMAL HIGH (ref 70–99)
Glucose-Capillary: 140 mg/dL — ABNORMAL HIGH (ref 70–99)

## 2019-01-20 LAB — COMPREHENSIVE METABOLIC PANEL
ALT: 104 U/L — ABNORMAL HIGH (ref 0–44)
AST: 44 U/L — ABNORMAL HIGH (ref 15–41)
Albumin: 3.6 g/dL (ref 3.5–5.0)
Alkaline Phosphatase: 50 U/L (ref 38–126)
Anion gap: 12 (ref 5–15)
BUN: 35 mg/dL — ABNORMAL HIGH (ref 6–20)
CO2: 25 mmol/L (ref 22–32)
Calcium: 8.9 mg/dL (ref 8.9–10.3)
Chloride: 98 mmol/L (ref 98–111)
Creatinine, Ser: 0.83 mg/dL (ref 0.44–1.00)
GFR calc Af Amer: >60 mL/min (ref >60)
GFR calc non Af Amer: >60 mL/min (ref >60)
Glucose, Bld: 227 mg/dL — ABNORMAL HIGH (ref 70–99)
Potassium: 4.1 mmol/L (ref 3.5–5.1)
Sodium: 135 mmol/L (ref 135–145)
Total Bilirubin: 0.7 mg/dL (ref 0.3–1.2)
Total Protein: 6.9 g/dL (ref 6.5–8.1)

## 2019-01-20 LAB — MAGNESIUM: Magnesium: 2.6 mg/dL — ABNORMAL HIGH (ref 1.7–2.4)

## 2019-01-20 LAB — FERRITIN: Ferritin: 287 ng/mL (ref 11–307)

## 2019-01-20 LAB — C-REACTIVE PROTEIN: CRP: <0.8 mg/dL (ref <1.0)

## 2019-01-20 LAB — D-DIMER, QUANTITATIVE: D-Dimer, Quant: 0.41 ug/mL-FEU (ref 0.00–0.50)

## 2019-01-20 MED ORDER — METHYLPREDNISOLONE SODIUM SUCC 40 MG IJ SOLR
40.0000 mg | INTRAMUSCULAR | Status: DC
  Administered 2019-01-21: 40 mg via INTRAVENOUS
  Filled 2019-01-20: qty 1

## 2019-01-20 NOTE — Progress Notes (Signed)
PROGRESS NOTE                                                                                                                                                                                                             Patient Demographics:    Meredith FergusonJennifer Olson, is a 48 y.o. female, DOB - 1970-05-22, ZOX:[id number]  Outpatient Primary MD for the patient is Lucianne LeiUppin, Nina, MD   Admit date - 01/15/2019   LOS - 5  No chief complaint on file.      Brief Narrative: Patient is a 48 y.o. female with PMHx of DM-2, asthma, fibromyalgia-who was [facility] from 10/7-10/13-for acute hypoxemic respiratory failure secondary to COVID-19 pneumonia.  Patient was diagnosed with COVID-19 on 10/6-this was the very same day she first started having symptoms.  Patient during her recent hospitalization was at one point on 8 L of oxygen-she was treated with steroids/Remdesivir-and titrated down to room air and discharged home in a stable manner.  She presented back to the hospital on 10/14 with worsening fatigue-was found to have worsening hypoxemia-started on IV steroids and [facility].  She was subsequently also treated with Actemra on 10/15, and transfused 1 unit of convalescent plasma on 10/15.  See below for further details.    Subjective:    Meredith FergusonJennifer Olson continues to improve-down to 2L of O2 this morning.   Assessment  & Plan :   Acute Hypoxic Resp Failure due to Covid 19 Viral pneumonia: Continues to improve, down to 2L of O2. Continue tapering steroids-change solumedrol to daily dosing-continue to encourage incentive spirometry, flutter valve.    Fever: afebrile  O2 requirements: On 2 l/m  COVID-19 Labs: Recent Labs    01/18/19 0509 01/19/19 0530 01/20/19 0350  DDIMER 0.34 0.35 0.41  FERRITIN 187 185 287  CRP <0.8 <0.8 <0.8    No results found  for: SARSCOV2NAA   COVID-19 Medications: Steroids:10/14>> Remdesivir: Not given this admission-she completed a 5-day course last admission Actemra:10/15 x 1 Convalescent Plasma:10/15 x1 Research Studies:N/A  Other medications: Diuretics:Euvolemic-repeat Lasix x1 again today to maintain negative balance. Antibiotics:Not needed as no evidence of bacterial infection-recent procalcitonin on 10/7 was negative.  Prone/Incentive Spirometry: encouraged spirometry use 3-4/hour.  DVT Prophylaxis  :  Lovenox   DM-2 (A1c 7.2  on 10/8) with uncontrolled hyperglycemia: CBGs relatively stable-continue Lantus 30 units, 4 units of novolog with meals, and SSI.   CBG (last 3)  Recent Labs    01/19/19 1648 01/19/19 2116 01/20/19 0839  GLUCAP 215* 241* 200*    Bronchial asthma: No signs of exacerbation-stable-continue bronchodilators  Obesity: Estimated body mass index is 34.97 kg/m as calculated from the following:   Height as of this encounter: 5\' 8"  (1.727 m).   Weight as of this encounter: 104.3 kg.   ABG: No results found for: PHART, PCO2ART, PO2ART, HCO3, TCO2, ACIDBASEDEF, O2SAT  Vent Settings: N/A   Condition -stable  Family Communication  : Spoke with spouse on 10/18  Code Status :  Full Code  Diet :  Diet Order            Diet 2 gram sodium Room service appropriate? Yes; Fluid consistency: Thin  Diet effective now               Disposition Plan  :  Remain hospitalized  Barriers to discharge:still hypoxic-need to wean O2 further-anticipate home in the next few days-may need home O2  Consults  :  None  Procedures  :  None  Antibiotics  :    Anti-infectives (From admission, onward)   None      Inpatient Medications  Scheduled Meds: . DULoxetine  60 mg Oral Daily  . enoxaparin (LOVENOX) injection  55 mg Subcutaneous Q24H  . insulin aspart  0-20 Units Subcutaneous TID WC  . insulin aspart  0-5 Units Subcutaneous QHS  . insulin aspart  4 Units Subcutaneous  TID WC  . insulin glargine  30 Units Subcutaneous QHS  . Ipratropium-Albuterol  2 puff Inhalation Q6H  . methylPREDNISolone (SOLU-MEDROL) injection  40 mg Intravenous Q12H  . pneumococcal 23 valent vaccine  0.5 mL Intramuscular Tomorrow-1000  . vitamin C  500 mg Oral Daily  . zinc sulfate  220 mg Oral Daily   Continuous Infusions:  PRN Meds:.acetaminophen, chlorpheniramine-HYDROcodone, guaiFENesin-dextromethorphan, ondansetron **OR** ondansetron (ZOFRAN) IV, oxymetazoline, polyethylene glycol   Time Spent in minutes  25  See all Orders from today for further details   Oren Binet M.D on 01/20/2019 at 10:37 AM  To page go to [email or web address] - use universal password  [facility] -  Office  [phone]    Objective:   Vitals:   01/19/19 1650 01/19/19 2031 01/20/19 0349 01/20/19 0916  BP: 105/62 103/67  104/69  Pulse: 79     Resp: 16  20   Temp: 98.2 F (36.8 C) 98.8 F (37.1 C) 98.9 F (37.2 C)   TempSrc: Oral Oral Oral   SpO2: 91%     Weight:      Height:        Wt Readings from Last 3 Encounters:  01/15/19 104.3 kg  01/08/19 110.9 kg  07/19/15 106.6 kg    No intake or output data in the 24 hours ending 01/20/19 1037   Physical Exam Gen Exam:Alert awake-not in any distress HEENT:atraumatic, normocephalic Chest: B/L clear to auscultation anteriorly CVS:S1S2 regular Abdomen:soft non tender, non distended Extremities:no edema Neurology: Non focal Skin: no rash   Data Review:    CBC Recent Labs  Lab 01/16/19 0440 01/17/19 0400 01/18/19 0509 01/19/19 0530 01/20/19 0350  WBC 5.1 8.9 11.9* 9.7 8.2  HGB 14.5 13.5 14.2 14.3 14.7  HCT 44.7 41.7 44.5 43.5 45.0  PLT 432* 462* 510* 510* 470*  MCV 87.6 87.2 89.4 87.0 86.9  MCH 28.4 28.2  28.5 28.6 28.4  MCHC 32.4 32.4 31.9 32.9 32.7  RDW 13.3 13.3 13.6 13.2 13.1  LYMPHSABS 1.0 1.2 1.4 1.3 1.2  MONOABS 0.3 0.7 0.5 0.4 0.4  EOSABS 0.0 0.0 0.0 0.0 0.0  BASOSABS 0.0 0.0 0.0 0.0 0.0     Chemistries  Recent Labs  Lab 01/16/19 0440 01/17/19 0400 01/18/19 0509 01/19/19 0530 01/20/19 0350  NA 136 136 141 136 135  K 3.8 3.9 4.5 4.0 4.1  CL 101 100 100 99 98  CO2 24 26 31 26 25   GLUCOSE 251* 227* 224* 233* 227*  BUN 21* 26* 31* 32* 35*  CREATININE 0.78 0.77 0.86 0.83 0.83  CALCIUM 8.6* 8.7* 8.9 8.8* 8.9  MG 2.5* 2.6* 2.9* 2.5* 2.6*  AST 43* 27 29 31  44*  ALT 87* 62* 58* 63* 104*  ALKPHOS 64 55 58 58 50  BILITOT 0.3 0.4 0.9 0.5 0.7   ------------------------------------------------------------------------------------------------------------------ No results for input(s): CHOL, HDL, LDLCALC, TRIG, CHOLHDL, LDLDIRECT in the last 72 hours.  Lab Results  Component Value Date   HGBA1C 7.2 (H) 01/09/2019   ------------------------------------------------------------------------------------------------------------------ No results for input(s): TSH, T4TOTAL, T3FREE, THYROIDAB in the last 72 hours.  Invalid input(s): FREET3 ------------------------------------------------------------------------------------------------------------------ Recent Labs    01/19/19 0530 01/20/19 0350  FERRITIN 185 287    Coagulation profile No results for input(s): INR, PROTIME in the last 168 hours.  Recent Labs    01/19/19 0530 01/20/19 0350  DDIMER 0.35 0.41    Cardiac Enzymes No results for input(s): CKMB, TROPONINI, MYOGLOBIN in the last 168 hours.  Invalid input(s): CK ------------------------------------------------------------------------------------------------------------------    Component Value Date/Time   BNP 19.7 01/16/2019 0440    Micro Results No results found for this or any previous visit (from the past 240 hour(s)).  Radiology Reports Dg Chest 1 View  Result Date: 01/11/2019 CLINICAL DATA:  Coronavirus infection.  Shortness of breath. EXAM: CHEST  1 VIEW COMPARISON:  01/09/2019 FINDINGS: Worsening bilateral patchy pulmonary infiltrates and some  atelectasis. No lobar consolidation or collapse. No effusions. Heart and mediastinal shadows remain normal. IMPRESSION: Slight worsening of patchy bilateral pulmonary infiltrates with mild linear atelectasis Electronically Signed   By: 03/13/2019 M.D.   On: 01/11/2019 10:20   Dg Chest Port 1 View  Result Date: 01/09/2019 CLINICAL DATA:  Pneumonia due to [place] EXAM: PORTABLE CHEST 1 VIEW COMPARISON:  Yesterday FINDINGS: Normal heart size and mediastinal contours. No acute infiltrate or edema. No effusion or pneumothorax. No acute osseous findings. IMPRESSION: Negative chest. Electronically Signed   By: 03/13/2019 M.D.   On: 01/09/2019 06:59

## 2019-01-20 NOTE — Progress Notes (Signed)
Patient speaking with family on phone while this nurse was in the room. Patient and family members have no questions or concerns at this time.

## 2019-01-21 LAB — COMPREHENSIVE METABOLIC PANEL
ALT: 121 U/L — ABNORMAL HIGH (ref 0–44)
AST: 67 U/L — ABNORMAL HIGH (ref 15–41)
Albumin: 3.4 g/dL — ABNORMAL LOW (ref 3.5–5.0)
Alkaline Phosphatase: 50 U/L (ref 38–126)
Anion gap: 12 (ref 5–15)
BUN: 37 mg/dL — ABNORMAL HIGH (ref 6–20)
CO2: 28 mmol/L (ref 22–32)
Calcium: 8.6 mg/dL — ABNORMAL LOW (ref 8.9–10.3)
Chloride: 99 mmol/L (ref 98–111)
Creatinine, Ser: 0.9 mg/dL (ref 0.44–1.00)
GFR calc Af Amer: >60 mL/min (ref >60)
GFR calc non Af Amer: >60 mL/min (ref >60)
Glucose, Bld: 85 mg/dL (ref 70–99)
Potassium: 3.3 mmol/L — ABNORMAL LOW (ref 3.5–5.1)
Sodium: 139 mmol/L (ref 135–145)
Total Bilirubin: 1 mg/dL (ref 0.3–1.2)
Total Protein: 6.6 g/dL (ref 6.5–8.1)

## 2019-01-21 LAB — C-REACTIVE PROTEIN: CRP: <0.8 mg/dL (ref <1.0)

## 2019-01-21 LAB — GLUCOSE, CAPILLARY
Glucose-Capillary: 87 mg/dL (ref 70–99)
Glucose-Capillary: 177 mg/dL — ABNORMAL HIGH (ref 70–99)
Glucose-Capillary: 284 mg/dL — ABNORMAL HIGH (ref 70–99)

## 2019-01-21 LAB — FERRITIN: Ferritin: 313 ng/mL — ABNORMAL HIGH (ref 11–307)

## 2019-01-21 MED ORDER — INSULIN GLARGINE 100 UNIT/ML ~~LOC~~ SOLN
15.0000 [IU] | Freq: Every day | SUBCUTANEOUS | Status: DC
  Administered 2019-01-21: 15 [IU] via SUBCUTANEOUS
  Filled 2019-01-21 (×2): qty 0.15

## 2019-01-21 MED ORDER — PREDNISOLONE 5 MG PO TABS: 40.0000 mg | ORAL_TABLET | Freq: Every day | ORAL | Status: DC

## 2019-01-21 MED ORDER — POTASSIUM CHLORIDE CRYS ER 20 MEQ PO TBCR
40.0000 meq | EXTENDED_RELEASE_TABLET | Freq: Once | ORAL | Status: AC
  Administered 2019-01-21: 40 meq via ORAL
  Filled 2019-01-21: qty 2

## 2019-01-21 MED ORDER — INSULIN ASPART 100 UNIT/ML ~~LOC~~ SOLN
0.0000 [IU] | Freq: Three times a day (TID) | SUBCUTANEOUS | Status: DC
  Administered 2019-01-21: 5 [IU] via SUBCUTANEOUS
  Administered 2019-01-21: 2 [IU] via SUBCUTANEOUS
  Administered 2019-01-22: 1 [IU] via SUBCUTANEOUS

## 2019-01-21 MED ORDER — PREDNISONE 20 MG PO TABS
40.0000 mg | ORAL_TABLET | Freq: Every day | ORAL | Status: DC
  Administered 2019-01-21: 40 mg via ORAL
  Filled 2019-01-21: qty 2

## 2019-01-21 NOTE — Progress Notes (Addendum)
Physical Therapy Treatment Patient Details Name: Meredith Olson MRN: [id number] DOB: Nov 08, 1970 Today's Date: 01/21/2019    History of Present Illness 48 y/o female pt admitted to [facility] 10/7-10/13 w/ COVID. Readmitted 10/14 w/ fever, hypoxia and multifocal PNA. PMHx" anxiety, asthma, depression, fibromyalgia, GERD, migraines, dyspnea sec to asthma.    PT Comments    Pt making progress with tx, was better able to tolerate tx today and also needed less assistance than previous. Pt was able to ambulate approx 374ft with no AD, very slow cadenced ambulation, Pt was on 2L/min via [place] and managed to maintain sats in low 90s throughout session.    Follow Up Recommendations  Home health PT     Equipment Recommendations       Recommendations for Other Services       Precautions / Restrictions Precautions Precautions: Fall Restrictions Weight Bearing Restrictions: No    Mobility  Bed Mobility Overal bed mobility: Modified Independent                Transfers Overall transfer level: Modified independent Equipment used: None                Ambulation/Gait Ambulation/Gait assistance: Supervision Gait Distance (Feet): 300 Feet Assistive device: None Gait Pattern/deviations: WFL(Within Functional Limits)         Stairs             Wheelchair Mobility    Modified Rankin (Stroke Patients Only)       Balance Overall balance assessment: Modified Independent Sitting-balance support: Feet supported Sitting balance-Leahy Scale: Good     Standing balance support: During functional activity Standing balance-Leahy Scale: Good                              Cognition Arousal/Alertness: Awake/alert Behavior During Therapy: WFL for tasks assessed/performed Overall Cognitive Status: Within Functional Limits for tasks assessed                                        Exercises      General Comments General comments (skin  integrity, edema, etc.): Pt states she is to go home tomorrow, therapist asked if there were any lingering issues she would like to address and pt denies any PT related issues. Pt was better able to tolerate tx, Pt is on 2L/min via [place] and was able to ambulate approx 382ft with no AD and sats remaining in low 90s throughout.      Pertinent Vitals/Pain Pain Assessment: No/denies pain    Home Living                      Prior Function            PT Goals (current goals can now be found in the care plan section) Acute Rehab PT Goals Patient Stated Goal: get better and go home Progress towards PT goals: Progressing toward goals    Frequency    Min 3X/week      PT Plan Discharge plan needs to be updated    Co-evaluation              AM-PAC PT "6 Clicks" Mobility   Outcome Measure    Help needed moving from lying on your back to sitting on the side of a flat bed without using bedrails?: None Help needed  moving to and from a bed to a chair (including a wheelchair)?: None Help needed standing up from a chair using your arms (e.g., wheelchair or bedside chair)?: None Help needed to walk in hospital room?: A Little Help needed climbing 3-5 steps with a railing? : A Lot 6 Click Score: 17    End of Session Equipment Utilized During Treatment: Oxygen Activity Tolerance: Patient tolerated treatment well Patient left: in bed;with call bell/phone within reach   PT Visit Diagnosis: Other abnormalities of gait and mobility (R26.89);Muscle weakness (generalized) (M62.81)     Time: 5462-7035 PT Time Calculation (min) (ACUTE ONLY): 19 min  Charges:  $Gait Training: 8-22 mins                     Drema Pry, PT    Freddi Starr 01/21/2019, 3:42 PM

## 2019-01-21 NOTE — TOC Initial Note (Signed)
Transition of Care [facility]) - Initial/Assessment Note    Patient Details  Name: Meredith Olson MRN: [id number] Date of Birth: 09-May-1970  [facility] CM/SW Contact:    Weston Anna, LCSW Phone Number: 01/21/2019, 12:02 PM  Clinical Narrative:                  PT currently recommending SNF placement (patient previously admitted and discharged with [facility]). Patient currently has no bed offers and is uninsured. Patient is walking 40 feet with PT. Due to limited SNF facilities accepting covid + patients and patient needing LOG (no insurance) patient may need to continue to work with therapies to work towards getting home. CSW will continue to reach out to facilities to determine how to work through barriers.  Expected Discharge Plan: Skilled Nursing Facility Barriers to Discharge: Insurance Authorization, No SNF bed   Patient Goals and CMS Choice        Expected Discharge Plan and Services Expected Discharge Plan: [place]                                              Prior Living Arrangements/Services                       Activities of Daily Living Home Assistive Devices/Equipment: CBG Meter ADL Screening (condition at time of admission) Patient's cognitive ability adequate to safely complete daily activities?: Yes Is the patient deaf or have difficulty hearing?: No Does the patient have difficulty seeing, even when wearing glasses/contacts?: No Does the patient have difficulty concentrating, remembering, or making decisions?: No Patient able to express need for assistance with ADLs?: Yes Does the patient have difficulty dressing or bathing?: No Independently performs ADLs?: Yes (appropriate for developmental age) Does the patient have difficulty walking or climbing stairs?: Yes Weakness of Legs: None Weakness of Arms/Hands: None  Permission Sought/Granted                  Emotional Assessment       Orientation: :  Oriented to Self, Oriented to Place, Oriented to  Time, Oriented to Situation   Psych Involvement: No (comment)  Admission diagnosis:  covid 19 hypoxia fever Patient Active Problem List   Diagnosis Date Noted  . Acute respiratory failure with hypoxia ([place]) 01/15/2019  . Transaminitis 01/15/2019  . Viral pneumonia 01/09/2019  . Obesity (BMI 35.0-39.9 without comorbidity) 01/09/2019  . Respiratory failure with hypoxia ([place]) 01/09/2019  . Pneumonia due to COVID-19 virus 01/08/2019  . S/P vaginal hysterectomy 07/28/2015   PCP:  Nicholos Johns, MD Pharmacy:   [place], [place] - [name] [place] [address] Phone: [phone] Fax: [phone]     Social Determinants of Health (SDOH) Interventions    Readmission Risk Interventions No flowsheet data found.

## 2019-01-21 NOTE — Evaluation (Addendum)
Occupational Therapy Evaluation Patient Details Name: Meredith Olson MRN: [id number] DOB: 10/16/1970 Today's Date: 01/21/2019    History of Present Illness 48 y/o female pt admitted to [facility] 10/7-10/13 w/ COVID. Readmitted 10/14 w/ fever, hypoxia and multifocal PNA. PMHx" anxiety, asthma, depression, fibromyalgia, GERD, migraines, dyspnea sec to asthma.   Clinical Impression   This 48 y/o female presents with the above. Pt with recent admit and d/c home from [facility]. Prior to initial admit pt reports independence with ADL and functional mobility, reports prior to this admission pt with increased difficulty performing ADL and mobility tasks. Pt completing room level mobility without AD at minguard-supervision level this session. She tolerated completion of x2 standing grooming ADL tasks at supervision level; requires minA for LB ADL. Pt on 2L O2 during activity with lowest SpO2 noted 86%. She will benefit from continued acute OT services and recommend post acute therapy services (HH vs SNF, likely home with [facility]) to maximize her safety and independence with ADL and mobility. Will follow.     Follow Up Recommendations  Home health OT;Supervision/Assistance - 24 hour(noted pt likely unable to go to SNF due to no insurance)    Equipment Recommendations  3 in 1 bedside commode           Precautions / Restrictions Precautions Precautions: Fall Restrictions Weight Bearing Restrictions: No      Mobility Bed Mobility Overal bed mobility: Modified Independent                Transfers Overall transfer level: Modified independent Equipment used: None                  Balance Overall balance assessment: Modified Independent Sitting-balance support: Feet supported Sitting balance-Leahy Scale: Good     Standing balance support: During functional activity Standing balance-Leahy Scale: Good                             ADL either performed or assessed with clinical  judgement   ADL Overall ADL's : Needs assistance/impaired Eating/Feeding: Independent;Sitting   Grooming: Oral care;Wash/dry face;Supervision/safety;Standing   Upper Body Bathing: Min guard;Sitting   Lower Body Bathing: Minimal assistance;Sit to/from stand   Upper Body Dressing : Supervision/safety;Sitting   Lower Body Dressing: Minimal assistance;Sit to/from stand   Toilet Transfer: Supervision/safety;Min guard;Ambulation Toilet Transfer Details (indicate cue type and reason): simulated via transfer to/from EOB, short distance mobility in room Toileting- Clothing Manipulation and Hygiene: Min guard;Sit to/from stand       Functional mobility during ADLs: Min guard;Supervision/safety General ADL Comments: pt with weakness, decreased activity tolerance     Vision         Perception     Praxis      Pertinent Vitals/Pain Pain Assessment: No/denies pain     Hand Dominance     Extremity/Trunk Assessment Upper Extremity Assessment Upper Extremity Assessment: Generalized weakness   Lower Extremity Assessment Lower Extremity Assessment: Defer to PT evaluation       Communication Communication Communication: No difficulties   Cognition Arousal/Alertness: Awake/alert Behavior During Therapy: WFL for tasks assessed/performed Overall Cognitive Status: Within Functional Limits for tasks assessed                                 General Comments: pt tearful during session    General Comments  Pt states she is to go home tomorrow, therapist asked if  there were any lingering issues she would like to address and pt denies any PT related issues. Pt was better able to tolerate tx, Pt is on 2L/min via [place] and was able to ambulate approx 350ft with no AD and sats remaining in low 90s throughout.    Exercises Exercises: Other exercises Other Exercises Other Exercises: issued level 2 theraband and corresponding HEP - will benefit from further review   Shoulder  Instructions      Home Living Family/patient expects to be discharged to:: Private residence Living Arrangements: Spouse/significant other;Children Available Help at Discharge: Family Type of Home: House             Bathroom Shower/Tub: Teaching laboratory technician Toilet: Handicapped height     Home Equipment: None          Prior Functioning/Environment Level of Independence: Independent        Comments: independent prior to initial admit; pt reports after first d/c home prior to this admit she became much weaker and had difficulty caring for herself        OT Problem List: Decreased strength;Decreased activity tolerance;Decreased knowledge of use of DME or AE;Decreased knowledge of precautions;Obesity;Cardiopulmonary status limiting activity      OT Treatment/Interventions: Self-care/ADL training;Therapeutic exercise;Energy conservation;DME and/or AE instruction;Therapeutic activities;Patient/family education;Balance training    OT Goals(Current goals can be found in the care plan section) Acute Rehab OT Goals Patient Stated Goal: get better and go home OT Goal Formulation: With patient Time For Goal Achievement: 02/04/19 Potential to Achieve Goals: Good  OT Frequency: Min 3X/week   Barriers to D/C:            Co-evaluation              AM-PAC OT "6 Clicks" Daily Activity     Outcome Measure Help from another person eating meals?: None Help from another person taking care of personal grooming?: None Help from another person toileting, which includes using toliet, bedpan, or urinal?: A Little Help from another person bathing (including washing, rinsing, drying)?: A Little Help from another person to put on and taking off regular upper body clothing?: None Help from another person to put on and taking off regular lower body clothing?: A Little 6 Click Score: 21   End of Session Equipment Utilized During Treatment: Oxygen Nurse Communication: Mobility  status  Activity Tolerance: Patient tolerated treatment well Patient left: in bed;with call bell/phone within reach  OT Visit Diagnosis: Muscle weakness (generalized) (M62.81);Other (comment)(decreased activity tolerance)                Time: 2778-2423 OT Time Calculation (min): 22 min Charges:  OT General Charges $OT Visit: 1 Visit OT Evaluation $OT Eval Moderate Complexity: 1 Mod  Lou Cal, OT [employer] Pager [phone] Office [phone]   Raymondo Band 01/21/2019, 3:58 PM

## 2019-01-21 NOTE — Progress Notes (Signed)
Patient talking with spouse on telephone when entering room. Patient asked about discharge plans and had questions about transport. Patient's husband states that he plans to pick up patient when she is ready to discharge. He did not have any further questions at this time stating that his wife keeps him informed of her progress.

## 2019-01-21 NOTE — TOC Progression Note (Signed)
Transition of Care [facility]) - Progression Note    Patient Details  Name: Meredith Olson MRN: [id number] Date of Birth: [birth year]  [facility]) CM/SW Bernie, LCSW Phone Number: 01/21/2019, 2:48 PM  Clinical Narrative:     Patient set up with [employer] for o2 needs- Magda Paganini accepted referral. O2 will be delivered to patients house this evening in preparation for DC on 10/21. If patient does not have ride home [facility] can provide transportation.  Expected Discharge Plan: Skilled Nursing Facility Barriers to Discharge: Insurance Authorization, No SNF bed  Expected Discharge Plan and Services Expected Discharge Plan: [place]                                               Social Determinants of Health (SDOH) Interventions    Readmission Risk Interventions No flowsheet data found.

## 2019-01-21 NOTE — Progress Notes (Addendum)
PROGRESS NOTE                                                                                                                                                                                                             Patient Demographics:    Meredith FergusonJennifer Olson, is a 48 y.o. female, DOB - February 06, 1971, ONG:[id number]  Outpatient Primary MD for the patient is Lucianne LeiUppin, Nina, MD   Admit date - 01/15/2019   LOS - 6  No chief complaint on file.      Brief Narrative: Patient is a 48 y.o. female with PMHx of DM-2, asthma, fibromyalgia-who was [facility] from 10/7-10/13-for acute hypoxemic respiratory failure secondary to COVID-19 pneumonia.  Patient was diagnosed with COVID-19 on 10/6-this was the very same day she first started having symptoms.  Patient during her recent hospitalization was at one point on 8 L of oxygen-she was treated with steroids/Remdesivir-and titrated down to room air and discharged home in a stable manner.  She presented back to the hospital on 10/14 with worsening fatigue-was found to have worsening hypoxemia-started on IV steroids and [facility].  She was subsequently also treated with Actemra on 10/15, and transfused 1 unit of convalescent plasma on 10/15.  See below for further details.    Subjective:    Meredith FergusonJennifer Olson continues to feel better-unable to wean off oxygen-she dropped to the high 80s on room air.   Assessment  & Plan :   Acute Hypoxic Resp Failure due to Covid 19 Viral pneumonia: Overall much improved-requiring 2 L of oxygen at rest-have asked RN to ambulate and see how much of oxygen she requires.  Stop Solu-Medrol-transition to prednisone.  Continue to encourage incentive spirometry.    Fever: afebrile  O2 requirements: On 2 l/m  COVID-19 Labs: Recent Labs    01/19/19 0530 01/20/19 0350 01/21/19 0333   DDIMER 0.35 0.41  --   FERRITIN 185 287 313*  CRP <0.8 <0.8 <0.8    No results found for: SARSCOV2NAA   COVID-19 Medications: Steroids:10/14>> Remdesivir: Not given this admission-she completed a 5-day course last admission Actemra:10/15 x 1 Convalescent Plasma:10/15 x1 Research Studies:N/A  Other medications: Diuretics:Euvolemic-hold diuretics today Antibiotics:Not needed as no evidence of bacterial infection-recent procalcitonin on 10/7 was negative.  Prone/Incentive Spirometry: encouraged spirometry  use 3-4/hour.  DVT Prophylaxis  :  Lovenox   DM-2 (A1c 7.2 on 10/8) with uncontrolled hyperglycemia: CBG this am-only in the 80's steroids being tapered off-decrease Lantus to 15 units. Will likely require Metformin on discharge  CBG (last 3)  Recent Labs    01/20/19 1643 01/20/19 2206 01/21/19 0813  GLUCAP 219* 140* 87    Bronchial asthma: No signs of exacerbation-stable-continue bronchodilators  Obesity: Estimated body mass index is 34.97 kg/m as calculated from the following:   Height as of this encounter: 5\' 8"  (1.727 m).   Weight as of this encounter: 104.3 kg.   ABG: No results found for: PHART, PCO2ART, PO2ART, HCO3, TCO2, ACIDBASEDEF, O2SAT  Vent Settings: N/A   Condition -stable  Family Communication  : spouse over the phone 10/20  Code Status :  Full Code  Diet :  Diet Order            Diet 2 gram sodium Room service appropriate? Yes; Fluid consistency: Thin  Diet effective now               Disposition Plan  :  Remain hospitalized-hopefully home tomorrow-REFUSES SNF  Barriers to discharge:still hypoxic-need to wean O2 further-anticipate home tomorrow-likely will require Home O2  Consults  :  None  Procedures  :  None  Antibiotics  :    Anti-infectives (From admission, onward)   None      Inpatient Medications  Scheduled Meds: . DULoxetine  60 mg Oral Daily  . enoxaparin (LOVENOX) injection  55 mg Subcutaneous Q24H  . insulin  aspart  0-9 Units Subcutaneous TID WC  . insulin aspart  4 Units Subcutaneous TID WC  . insulin glargine  15 Units Subcutaneous QHS  . Ipratropium-Albuterol  2 puff Inhalation Q6H  . pneumococcal 23 valent vaccine  0.5 mL Intramuscular Tomorrow-1000  . predniSONE  40 mg Oral Q breakfast  . vitamin C  500 mg Oral Daily  . zinc sulfate  220 mg Oral Daily   Continuous Infusions:  PRN Meds:.acetaminophen, chlorpheniramine-HYDROcodone, guaiFENesin-dextromethorphan, ondansetron **OR** ondansetron (ZOFRAN) IV, oxymetazoline, polyethylene glycol   Time Spent in minutes  25  See all Orders from today for further details   11/20 M.D on 01/21/2019 at 12:08 PM  To page go to [email or web address] - use universal password  [facility] -  Office  [phone]    Objective:   Vitals:   01/20/19 0916 01/20/19 2038 01/21/19 0417 01/21/19 0700  BP: 104/69 102/65 (!) 93/53 (!) 90/59  Pulse:    71  Resp:  20 18   Temp:  99.1 F (37.3 C) 98.5 F (36.9 C) 99.1 F (37.3 C)  TempSrc:  Oral Oral Oral  SpO2:    94%  Weight:      Height:        Wt Readings from Last 3 Encounters:  01/15/19 104.3 kg  01/08/19 110.9 kg  07/19/15 106.6 kg     Intake/Output Summary (Last 24 hours) at 01/21/2019 1208 Last data filed at 01/21/2019 0400 Gross per 24 hour  Intake 240 ml  Output 1200 ml  Net -960 ml     Physical Exam Gen Exam:Alert awake-not in any distress HEENT:atraumatic, normocephalic Chest: B/L clear to auscultation anteriorly CVS:S1S2 regular Abdomen:soft non tender, non distended Extremities:no edema Neurology: Non focal Skin: no rash   Data Review:    CBC Recent Labs  Lab 01/16/19 0440 01/17/19 0400 01/18/19 0509 01/19/19 0530 01/20/19 0350  WBC 5.1 8.9 11.9* 9.7 8.2  HGB 14.5 13.5 14.2 14.3 14.7  HCT 44.7 41.7 44.5 43.5 45.0  PLT 432* 462* 510* 510* 470*  MCV 87.6 87.2 89.4 87.0 86.9  MCH 28.4 28.2 28.5 28.6 28.4  MCHC 32.4 32.4 31.9 32.9 32.7  RDW  13.3 13.3 13.6 13.2 13.1  LYMPHSABS 1.0 1.2 1.4 1.3 1.2  MONOABS 0.3 0.7 0.5 0.4 0.4  EOSABS 0.0 0.0 0.0 0.0 0.0  BASOSABS 0.0 0.0 0.0 0.0 0.0    Chemistries  Recent Labs  Lab 01/16/19 0440 01/17/19 0400 01/18/19 0509 01/19/19 0530 01/20/19 0350 01/21/19 0333  NA 136 136 141 136 135 139  K 3.8 3.9 4.5 4.0 4.1 3.3*  CL 101 100 100 99 98 99  CO2 24 26 31 26 25 28   GLUCOSE 251* 227* 224* 233* 227* 85  BUN 21* 26* 31* 32* 35* 37*  CREATININE 0.78 0.77 0.86 0.83 0.83 0.90  CALCIUM 8.6* 8.7* 8.9 8.8* 8.9 8.6*  MG 2.5* 2.6* 2.9* 2.5* 2.6*  --   AST 43* 27 29 31  44* 67*  ALT 87* 62* 58* 63* 104* 121*  ALKPHOS 64 55 58 58 50 50  BILITOT 0.3 0.4 0.9 0.5 0.7 1.0   ------------------------------------------------------------------------------------------------------------------ No results for input(s): CHOL, HDL, LDLCALC, TRIG, CHOLHDL, LDLDIRECT in the last 72 hours.  Lab Results  Component Value Date   HGBA1C 7.2 (H) 01/09/2019   ------------------------------------------------------------------------------------------------------------------ No results for input(s): TSH, T4TOTAL, T3FREE, THYROIDAB in the last 72 hours.  Invalid input(s): FREET3 ------------------------------------------------------------------------------------------------------------------ Recent Labs    01/20/19 0350 01/21/19 0333  FERRITIN 287 313*    Coagulation profile No results for input(s): INR, PROTIME in the last 168 hours.  Recent Labs    01/19/19 0530 01/20/19 0350  DDIMER 0.35 0.41    Cardiac Enzymes No results for input(s): CKMB, TROPONINI, MYOGLOBIN in the last 168 hours.  Invalid input(s): CK ------------------------------------------------------------------------------------------------------------------    Component Value Date/Time   BNP 19.7 01/16/2019 0440    Micro Results No results found for this or any previous visit (from the past 240 hour(s)).  Radiology Reports  Dg Chest 1 View  Result Date: 01/11/2019 CLINICAL DATA:  Coronavirus infection.  Shortness of breath. EXAM: CHEST  1 VIEW COMPARISON:  01/09/2019 FINDINGS: Worsening bilateral patchy pulmonary infiltrates and some atelectasis. No lobar consolidation or collapse. No effusions. Heart and mediastinal shadows remain normal. IMPRESSION: Slight worsening of patchy bilateral pulmonary infiltrates with mild linear atelectasis Electronically Signed   By: Nelson Chimes M.D.   On: 01/11/2019 10:20   Dg Chest Port 1 View  Result Date: 01/09/2019 CLINICAL DATA:  Pneumonia due to [place] EXAM: PORTABLE CHEST 1 VIEW COMPARISON:  Yesterday FINDINGS: Normal heart size and mediastinal contours. No acute infiltrate or edema. No effusion or pneumothorax. No acute osseous findings. IMPRESSION: Negative chest. Electronically Signed   By: Monte Fantasia M.D.   On: 01/09/2019 06:59

## 2019-01-21 NOTE — Progress Notes (Signed)
Patient in bed resting, all morning medication given well tolerated. Patient on phone with husband and gave him update that she will discharge tomorrow as per MD. No s/s of pain or distress will continue to monitor for remainder of shift.

## 2019-01-22 LAB — GLUCOSE, CAPILLARY
Glucose-Capillary: 221 mg/dL — ABNORMAL HIGH (ref 70–99)
Glucose-Capillary: 133 mg/dL — ABNORMAL HIGH (ref 70–99)

## 2019-01-22 MED ORDER — PREDNISONE 20 MG PO TABS
30.0000 mg | ORAL_TABLET | Freq: Every day | ORAL | Status: DC
  Administered 2019-01-22: 30 mg via ORAL

## 2019-01-22 MED ORDER — METFORMIN HCL 500 MG PO TABS: 500.0000 mg | ORAL_TABLET | Freq: Two times a day (BID) | ORAL | 1 refills | Status: DC

## 2019-01-22 MED ORDER — PREDNISONE 10 MG PO TABS: ORAL_TABLET | ORAL | 0 refills | Status: DC

## 2019-01-22 NOTE — Discharge Summary (Signed)
PATIENT DETAILS Name: Meredith Olson Age: 48 y.o. Sex: female Date of Birth: [birth year] MRN: [id number]. Admitting Physician: Osvaldo Shipper, MD BTD:VVOHY, Coralee North, MD  Admit Date: 01/15/2019 Discharge date: 01/22/2019  Recommendations for Outpatient Follow-up:  1. Follow up with PCP in 1-2 weeks 2. Please obtain CMP/CBC in one week-continues to have mild transaminitis-follow 3. Repeat Chest Xray in 4-6 week 4. Please assess if patient still requires O2 on discharge  Admitted From:  Home  Disposition: Home with home health services   Home Health: Yes  Equipment/Devices: Oxygen 2L   Discharge Condition: Stable  CODE STATUS: FULL CODE  Diet recommendation:  Diet Order            Diet - low sodium heart healthy        Diet Carb Modified        Diet 2 gram sodium Room service appropriate? Yes; Fluid consistency: Thin  Diet effective now               Brief Summary: See H&P, Labs, Consult and Test reports for all details in brief, Patient is a 48 y.o. female with PMHx of DM-2, asthma, fibromyalgia-who was hospitalized at [facility] from 10/7-10/13-for acute hypoxemic respiratory failure secondary to COVID-19 pneumonia.  Patient was diagnosed with COVID-19 on 10/6-this was the very same day she first started having symptoms.  Patient during her recent hospitalization was at one point on 8 L of oxygen-she was treated with steroids/Remdesivir-and titrated down to room air and discharged home in a stable manner.  She presented back to the hospital on 10/14 with worsening fatigue-was found to have worsening hypoxemia-started on IV steroids and admitted back to the [facility] at [facility].  She was subsequently also treated with Actemra on 10/15, and transfused 1 unit of convalescent plasma on 10/15.  She was continued on IV Steroids-with these measures patient finally improved, and by day of discharge was down to just 2Lof O2. See below  for further details  Brief Hospital Course: Acute Hypoxic Resp Failure due to Covid 19 Viral pneumonia: Overall much improved-requiring 2 L of oxygen. Since clinically improved-and stable for the past few days-ok to discharge home-will be continued on a steroid taper.   COVID-19 Labs:  Recent Labs    01/20/19 0350 01/21/19 0333  DDIMER 0.41  --   FERRITIN 287 313*  CRP <0.8 <0.8    No results found for: SARSCOV2NAA   COVID-19 Medications: Steroids:10/14>> Remdesivir: Not given this admission-she completed a 5-day course last admission Actemra:10/15 x 1 Convalescent Plasma:10/15 x1 Research Studies:N/A  DM-2 (A1c 7.2 on 10/8) with uncontrolled hyperglycemia: CBG's were significantly elevated due to steroids-but as steroids were being tapered down-CBG's have started to improve. She will be discharged on metformin-further optimization will be deferred to the outpatient setting.   Bronchial asthma: No signs of exacerbation-stable-continue bronchodilators  Obesity: Estimated body mass index is 34.97 kg/m as calculated from the following:   Height as of this encounter:  (1.727 m).   Weight as of this encounter: 104.3 kg.   Procedures/Studies: None  Discharge Diagnoses:  Active Problems:   Pneumonia due to COVID-19 virus   Obesity (BMI 35.0-39.9 without comorbidity)   Acute respiratory failure with hypoxia [facility])   Transaminitis   Discharge Instructions:    Person Under Monitoring Name: Meredith Olson  Location: [address] [place] [address]   Infection Prevention Recommendations for Individuals Confirmed to have, or Being Evaluated for, 2019 Novel  Coronavirus (COVID-19) Infection Who Receive Care at Home  Individuals who are confirmed to have, or are being evaluated for, COVID-19 should follow the prevention steps below until a healthcare provider or local or state health department says they can return to normal activities.  Stay home except to  get medical care You should restrict activities outside your home, except for getting medical care. Do not go to work, school, or public areas, and do not use public transportation or taxis.  Call ahead before visiting your doctor Before your medical appointment, call the healthcare provider and tell them that you have, or are being evaluated for, COVID-19 infection. This will help the healthcare providers office take steps to keep other people from getting infected. Ask your healthcare provider to call the local or state health department.  Monitor your symptoms Seek prompt medical attention if your illness is worsening (e.g., difficulty breathing). Before going to your medical appointment, call the healthcare provider and tell them that you have, or are being evaluated for, COVID-19 infection. Ask your healthcare provider to call the local or state health department.  Wear a facemask You should wear a facemask that covers your nose and mouth when you are in the same room with other people and when you visit a healthcare provider. People who live with or visit you should also wear a facemask while they are in the same room with you.  Separate yourself from other people in your home As much as possible, you should stay in a different room from other people in your home. Also, you should use a separate bathroom, if available.  Avoid sharing household items You should not share dishes, drinking glasses, cups, eating utensils, towels, bedding, or other items with other people in your home. After using these items, you should wash them thoroughly with soap and water.  Cover your coughs and sneezes Cover your mouth and nose with a tissue when you cough or sneeze, or you can cough or sneeze into your sleeve. Throw used tissues in a lined trash can, and immediately wash your hands with soap and water for at least 20 seconds or use an alcohol-based hand rub.  Wash your [facility] your hands  often and thoroughly with soap and water for at least 20 seconds. You can use an alcohol-based hand sanitizer if soap and water are not available and if your hands are not visibly dirty. Avoid touching your eyes, nose, and mouth with unwashed hands.   Prevention Steps for Caregivers and Household Members of Individuals Confirmed to have, or Being Evaluated for, COVID-19 Infection Being Cared for in the Home  If you live with, or provide care at home for, a person confirmed to have, or being evaluated for, COVID-19 infection please follow these guidelines to prevent infection:  Follow healthcare providers instructions Make sure that you understand and can help the patient follow any healthcare provider instructions for all care.  Provide for the patients basic needs You should help the patient with basic needs in the home and provide support for getting groceries, prescriptions, and other personal needs.  Monitor the patients symptoms If they are getting sicker, call his or her medical provider and tell them that the patient has, or is being evaluated for, COVID-19 infection. This will help the healthcare providers office take steps to keep other people from getting infected. Ask the healthcare provider to call the local or state health department.  Limit the number of people who have contact with the  patient  If possible, have only one caregiver for the patient.  Other household members should stay in another home or place of residence. If this is not possible, they should stay  in another room, or be separated from the patient as much as possible. Use a separate bathroom, if available.  Restrict visitors who do not have an essential need to be in the home.  Keep older adults, very young children, and other sick people away from the patient Keep older adults, very young children, and those who have compromised immune systems or chronic health conditions away from the patient.  This includes people with chronic heart, lung, or kidney conditions, diabetes, and cancer.  Ensure good ventilation Make sure that shared spaces in the home have good air flow, such as from an air conditioner or an opened window, weather permitting.  Wash your hands often  Wash your hands often and thoroughly with soap and water for at least 20 seconds. You can use an alcohol based hand sanitizer if soap and water are not available and if your hands are not visibly dirty.  Avoid touching your eyes, nose, and mouth with unwashed hands.  Use disposable paper towels to dry your hands. If not available, use dedicated cloth towels and replace them when they become wet.  Wear a facemask and gloves  Wear a disposable facemask at all times in the room and gloves when you touch or have contact with the patients blood, body fluids, and/or secretions or excretions, such as sweat, saliva, sputum, nasal mucus, vomit, urine, or feces.  Ensure the mask fits over your nose and mouth tightly, and do not touch it during use.  Throw out disposable facemasks and gloves after using them. Do not reuse.  Wash your hands immediately after removing your facemask and gloves.  If your personal clothing becomes contaminated, carefully remove clothing and launder. Wash your hands after handling contaminated clothing.  Place all used disposable facemasks, gloves, and other waste in a lined container before disposing them with other household waste.  Remove gloves and wash your hands immediately after handling these items.  Do not share dishes, glasses, or other household items with the patient  Avoid sharing household items. You should not share dishes, drinking glasses, cups, eating utensils, towels, bedding, or other items with a patient who is confirmed to have, or being evaluated for, COVID-19 infection.  After the person uses these items, you should wash them thoroughly with soap and water.  Wash laundry  thoroughly  Immediately remove and wash clothes or bedding that have blood, body fluids, and/or secretions or excretions, such as sweat, saliva, sputum, nasal mucus, vomit, urine, or feces, on them.  Wear gloves when handling laundry from the patient.  Read and follow directions on labels of laundry or clothing items and detergent. In general, wash and dry with the warmest temperatures recommended on the label.  Clean all areas the individual has used often  Clean all touchable surfaces, such as counters, tabletops, doorknobs, bathroom fixtures, toilets, phones, keyboards, tablets, and bedside tables, every day. Also, clean any surfaces that may have blood, body fluids, and/or secretions or excretions on them.  Wear gloves when cleaning surfaces the patient has come in contact with.  Use a diluted bleach solution (e.g., dilute bleach with 1 part bleach and 10 parts water) or a household disinfectant with a label that says EPA-registered for coronaviruses. To make a bleach solution at home, add 1 tablespoon of bleach to 1 quart (  4 cups) of water. For a larger supply, add  cup of bleach to 1 gallon (16 cups) of water.  Read labels of cleaning products and follow recommendations provided on product labels. Labels contain instructions for safe and effective use of the cleaning product including precautions you should take when applying the product, such as wearing gloves or eye protection and making sure you have good ventilation during use of the product.  Remove gloves and wash hands immediately after cleaning.  Monitor yourself for signs and symptoms of illness Caregivers and household members are considered close contacts, should monitor their health, and will be asked to limit movement outside of the home to the extent possible. Follow the monitoring steps for close contacts listed on the symptom monitoring form.   ? If you have additional questions, contact your local health department  or call the epidemiologist on call at [phone] (available 24/7). ? This guidance is subject to change. For the most up-to-date guidance from CDC, please refer to their website: TripMetro.hu   Activity:  As tolerated    Discharge Instructions    Call MD for:  difficulty breathing, headache or visual disturbances   Complete by: As directed    Call MD for:  persistant dizziness or light-headedness   Complete by: As directed    Diet - low sodium heart healthy   Complete by: As directed    Diet Carb Modified   Complete by: As directed    Discharge instructions   Complete by: As directed    Follow with Primary MD  Lucianne Lei, MD in 1-2 weeks  Please get a complete blood count and chemistry panel checked by your Primary MD at your next visit, and again as instructed by your Primary MD.  Get Medicines reviewed and adjusted: Please take all your medications with you for your next visit with your Primary MD  Laboratory/radiological data: Please request your Primary MD to go over all hospital tests and procedure/radiological results at the follow up, please ask your Primary MD to get all Hospital records sent to his/her office.  In some cases, they will be blood work, cultures and biopsy results pending at the time of your discharge. Please request that your primary care M.D. follows up on these results.  Also Note the following: If you experience worsening of your admission symptoms, develop shortness of breath, life threatening emergency, suicidal or homicidal thoughts you must seek medical attention immediately by calling 911 or calling your MD immediately  if symptoms less severe.  You must read complete instructions/literature along with all the possible adverse reactions/side effects for all the Medicines you take and that have been prescribed to you. Take any new Medicines after you have completely understood and accpet  all the possible adverse reactions/side effects.   Do not drive when taking Pain medications or sleeping medications (Benzodaizepines)  Do not take more than prescribed Pain, Sleep and Anxiety Medications. It is not advisable to combine anxiety,sleep and pain medications without talking with your primary care practitioner  Special Instructions: If you have smoked or chewed Tobacco  in the last 2 yrs please stop smoking, stop any regular Alcohol  and or any Recreational drug use.  Wear Seat belts while driving.  Please note: You were cared for by a hospitalist during your hospital stay. Once you are discharged, your primary care physician will handle any further medical issues. Please note that NO REFILLS for any discharge medications will be authorized once you are discharged, as  it is imperative that you return to your primary care physician (or establish a relationship with a primary care physician if you do not have one) for your post hospital discharge needs so that they can reassess your need for medications and monitor your lab values.   Increase activity slowly   Complete by: As directed      Allergies as of 01/22/2019      Reactions   Latex Itching   Red rash   Aspirin    Asthma flareup   Ibuprofen    Asthma flare up   Shellfish Allergy Hives      Medication List    TAKE these medications   acetaminophen 325 MG tablet Commonly known as: TYLENOL Take 2 tablets (650 mg total) by mouth every 6 (six) hours as needed for mild pain or headache (fever >/= 101).   albuterol 108 (90 Base) MCG/ACT inhaler Commonly known as: VENTOLIN HFA Inhale 1-2 puffs into the lungs every 6 (six) hours as needed for wheezing or shortness of breath.   budesonide-formoterol 160-4.5 MCG/ACT inhaler Commonly known as: SYMBICORT Inhale 2 puffs into the lungs 2 (two) times daily.   cetirizine 10 MG tablet Commonly known as: ZYRTEC Take 10 mg by mouth daily as needed for allergies.   DRY EYES  OP Apply 2 drops to eye daily as needed (for allergies or dry eyes).   DULoxetine 60 MG capsule Commonly known as: CYMBALTA Take 60 mg by mouth daily.   gabapentin 300 MG capsule Commonly known as: NEURONTIN Take 300 mg by mouth 3 (three) times daily. Patient only take it once at night   guaiFENesin-dextromethorphan 100-10 MG/5ML syrup Commonly known as: ROBITUSSIN DM Take 10 mLs by mouth every 6 (six) hours as needed for cough.   ketoconazole 2 % shampoo Commonly known as: NIZORAL Apply 1 application topically 2 (two) times a week.   metFORMIN 500 MG tablet Commonly known as: Glucophage Take 1 tablet (500 mg total) by mouth 2 (two) times daily with a meal.   montelukast 10 MG tablet Commonly known as: SINGULAIR Take 10 mg by mouth at bedtime.   predniSONE 10 MG tablet Commonly known as: DELTASONE Take 20 mg daily for 1 day,then 10 mg daily for 1 day, then stop   Vitamin D 125 MCG (5000 UT) Caps Take 1 capsule by mouth daily.            Durable Medical Equipment  (From admission, onward)         Start     Ordered   01/20/19 1703  For home use only DME oxygen  Once    Question Answer Comment  Length of Need 6 Months   Mode or (Route) Nasal cannula   Liters per Minute 2   Oxygen delivery system Gas      01/20/19 1702         Follow-up Information    Lucianne Lei, MD. Schedule an appointment as soon as possible for a visit in 2 week(s).   Specialty: Internal Medicine Contact information: [address] [place] [address] [phone]          Allergies  Allergen Reactions   Latex Itching    Red rash   Aspirin     Asthma flareup   Ibuprofen     Asthma flare up   Shellfish Allergy Hives   Consultations:   None   Other Procedures/Studies: Dg Chest 1 View  Result Date: 01/11/2019 CLINICAL DATA:  Coronavirus infection.  Shortness of breath. EXAM: CHEST  1 VIEW COMPARISON:  01/09/2019 FINDINGS: Worsening bilateral patchy  pulmonary infiltrates and some atelectasis. No lobar consolidation or collapse. No effusions. Heart and mediastinal shadows remain normal. IMPRESSION: Slight worsening of patchy bilateral pulmonary infiltrates with mild linear atelectasis Electronically Signed   By: Paulina Fusi M.D.   On: 01/11/2019 10:20   Dg Chest Port 1 View  Result Date: 01/09/2019 CLINICAL DATA:  Pneumonia due to [place] EXAM: PORTABLE CHEST 1 VIEW COMPARISON:  Yesterday FINDINGS: Normal heart size and mediastinal contours. No acute infiltrate or edema. No effusion or pneumothorax. No acute osseous findings. IMPRESSION: Negative chest. Electronically Signed   By: Marnee Spring M.D.   On: 01/09/2019 06:59     TODAY-DAY OF DISCHARGE:  Subjective:   Meredith Olson today has no headache,no chest abdominal pain,no new weakness tingling or numbness, feels much better wants to go home today.   Objective:   Blood pressure 99/68, pulse 64, temperature 99.6 F (37.6 C), temperature source Oral, resp. rate 19, height 5\' 8"  (1.727 m), weight 104.3 kg, SpO2 90 %. No intake or output data in the 24 hours ending 01/22/19 0834 Filed Weights   01/15/19 1605  Weight: 104.3 kg    Exam: Awake Alert, Oriented *3, No new F.N deficits, Normal affect [place].AT,PERRAL Supple Neck,No JVD, No cervical lymphadenopathy appriciated.  Symmetrical Chest wall movement, Good air movement bilaterally, CTAB RRR,No Gallops,Rubs or new Murmurs, No Parasternal Heave +ve B.Sounds, Abd Soft, Non tender, No organomegaly appriciated, No rebound -guarding or rigidity. No Cyanosis, Clubbing or edema, No new Rash or bruise   PERTINENT RADIOLOGIC STUDIES: Dg Chest 1 View  Result Date: 01/11/2019 CLINICAL DATA:  Coronavirus infection.  Shortness of breath. EXAM: CHEST  1 VIEW COMPARISON:  01/09/2019 FINDINGS: Worsening bilateral patchy pulmonary infiltrates and some atelectasis. No lobar consolidation or collapse. No effusions. Heart and mediastinal  shadows remain normal. IMPRESSION: Slight worsening of patchy bilateral pulmonary infiltrates with mild linear atelectasis Electronically Signed   By: 03/11/2019 M.D.   On: 01/11/2019 10:20   Dg Chest Port 1 View  Result Date: 01/09/2019 CLINICAL DATA:  Pneumonia due to [place] EXAM: PORTABLE CHEST 1 VIEW COMPARISON:  Yesterday FINDINGS: Normal heart size and mediastinal contours. No acute infiltrate or edema. No effusion or pneumothorax. No acute osseous findings. IMPRESSION: Negative chest. Electronically Signed   By: 03/11/2019 M.D.   On: 01/09/2019 06:59     PERTINENT LAB RESULTS: CBC: Recent Labs    01/20/19 0350  WBC 8.2  HGB 14.7  HCT 45.0  PLT 470*   CMET CMP     Component Value Date/Time   NA 139 01/21/2019 0333   K 3.3 (L) 01/21/2019 0333   CL 99 01/21/2019 0333   CO2 28 01/21/2019 0333   GLUCOSE 85 01/21/2019 0333   BUN 37 (H) 01/21/2019 0333   CREATININE 0.90 01/21/2019 0333   CALCIUM 8.6 (L) 01/21/2019 0333   PROT 6.6 01/21/2019 0333   ALBUMIN 3.4 (L) 01/21/2019 0333   AST 67 (H) 01/21/2019 0333   ALT 121 (H) 01/21/2019 0333   ALKPHOS 50 01/21/2019 0333   BILITOT 1.0 01/21/2019 0333   GFRNONAA >60 01/21/2019 0333   GFRAA >60 01/21/2019 0333    GFR Estimated Creatinine Clearance: 94.6 mL/min (by C-G formula based on SCr of 0.9 mg/dL). No results for input(s): LIPASE, AMYLASE in the last 72 hours. No results for input(s): CKTOTAL, CKMB, CKMBINDEX, TROPONINI in the last 72  hours. Invalid input(s): POCBNP Recent Labs    01/20/19 0350  DDIMER 0.41   No results for input(s): HGBA1C in the last 72 hours. No results for input(s): CHOL, HDL, LDLCALC, TRIG, CHOLHDL, LDLDIRECT in the last 72 hours. No results for input(s): TSH, T4TOTAL, T3FREE, THYROIDAB in the last 72 hours.  Invalid input(s): FREET3 Recent Labs    01/20/19 0350 01/21/19 0333  FERRITIN 287 313*   Coags: No results for input(s): INR in the last 72 hours.  Invalid input(s):  PT Microbiology: No results found for this or any previous visit (from the past 240 hour(s)).  FURTHER DISCHARGE INSTRUCTIONS:  Get Medicines reviewed and adjusted: Please take all your medications with you for your next visit with your Primary MD  Laboratory/radiological data: Please request your Primary MD to go over all hospital tests and procedure/radiological results at the follow up, please ask your Primary MD to get all Hospital records sent to his/her office.  In some cases, they will be blood work, cultures and biopsy results pending at the time of your discharge. Please request that your primary care M.D. goes through all the records of your hospital data and follows up on these results.  Also Note the following: If you experience worsening of your admission symptoms, develop shortness of breath, life threatening emergency, suicidal or homicidal thoughts you must seek medical attention immediately by calling 911 or calling your MD immediately  if symptoms less severe.  You must read complete instructions/literature along with all the possible adverse reactions/side effects for all the Medicines you take and that have been prescribed to you. Take any new Medicines after you have completely understood and accpet all the possible adverse reactions/side effects.   Do not drive when taking Pain medications or sleeping medications (Benzodaizepines)  Do not take more than prescribed Pain, Sleep and Anxiety Medications. It is not advisable to combine anxiety,sleep and pain medications without talking with your primary care practitioner  Special Instructions: If you have smoked or chewed Tobacco  in the last 2 yrs please stop smoking, stop any regular Alcohol  and or any Recreational drug use.  Wear Seat belts while driving.  Please note: You were cared for by a hospitalist during your hospital stay. Once you are discharged, your primary care physician will handle any further medical  issues. Please note that NO REFILLS for any discharge medications will be authorized once you are discharged, as it is imperative that you return to your primary care physician (or establish a relationship with a primary care physician if you do not have one) for your post hospital discharge needs so that they can reassess your need for medications and monitor your lab values.  Total Time spent coordinating discharge including counseling, education and face to face time equals 35 minutes.  SignedJeoffrey Massed: Meredith Olson 01/22/2019 8:34 AM

## 2019-01-22 NOTE — TOC Transition Note (Signed)
Transition of Care [facility]) - CM/SW Discharge Note   Patient Details  Name: Meredith Olson MRN: [id number] Date of Birth: 09-Nov-1970  Transition of Care [facility]) CM/SW Contact:  Weston Anna, LCSW Phone Number: 01/22/2019, 9:19 AM   Clinical Narrative:     Patient set to discharge home with [facility] providing DME needs for O2- O2 was delivered to patients home address on 10/20. Spouse is providing pickup- portable tank will be provided before her discharge. CSW is reaching out to our [facility]) to see if they can provide PT services. Will update note once return call from [facility].   Final next level of care: [place] Barriers to Discharge: No Barriers Identified   Patient Goals and CMS Choice        Discharge Placement                Patient to be transferred to facility by: Patients spouse is picking up   Patient and family notified of of transfer: 01/22/19  Discharge Plan and Services                DME Arranged: Oxygen DME Agency: [place] Date DME Agency Contacted: 01/21/19 Time DME Agency Contacted: 1200 Representative spoke with at DME Agency: Magda Paganini [place]: NA(Wanting to hear back from [facility] agency to determine if they can accept)          Social Determinants of Health (SDOH) Interventions     Readmission Risk Interventions No flowsheet data found.

## 2019-01-22 NOTE — Discharge Instructions (Signed)
Person Under Monitoring Name: Meredith Olson  Location: [address] [place] [address]   Infection Prevention Recommendations for Individuals Confirmed to have, or Being Evaluated for, 2019 Novel Coronavirus (COVID-19) Infection Who Receive Care at Home  Individuals who are confirmed to have, or are being evaluated for, COVID-19 should follow the prevention steps below until a healthcare provider or local or state health department says they can return to normal activities.  Stay home except to get medical care You should restrict activities outside your home, except for getting medical care. Do not go to work, school, or public areas, and do not use public transportation or taxis.  Call ahead before visiting your doctor Before your medical appointment, call the healthcare provider and tell them that you have, or are being evaluated for, COVID-19 infection. This will help the healthcare providers office take steps to keep other people from getting infected. Ask your healthcare provider to call the local or state health department.  Monitor your symptoms Seek prompt medical attention if your illness is worsening (e.g., difficulty breathing). Before going to your medical appointment, call the healthcare provider and tell them that you have, or are being evaluated for, COVID-19 infection. Ask your healthcare provider to call the local or state health department.  Wear a facemask You should wear a facemask that covers your nose and mouth when you are in the same room with other people and when you visit a healthcare provider. People who live with or visit you should also wear a facemask while they are in the same room with you.  Separate yourself from other people in your home As much as possible, you should stay in a different room from other people in your home. Also, you should use a separate bathroom, if available.  Avoid sharing household items You should not share  dishes, drinking glasses, cups, eating utensils, towels, bedding, or other items with other people in your home. After using these items, you should wash them thoroughly with soap and water.  Cover your coughs and sneezes Cover your mouth and nose with a tissue when you cough or sneeze, or you can cough or sneeze into your sleeve. Throw used tissues in a lined trash can, and immediately wash your hands with soap and water for at least 20 seconds or use an alcohol-based hand rub.  Wash your [facility] your hands often and thoroughly with soap and water for at least 20 seconds. You can use an alcohol-based hand sanitizer if soap and water are not available and if your hands are not visibly dirty. Avoid touching your eyes, nose, and mouth with unwashed hands.   Prevention Steps for Caregivers and Household Members of Individuals Confirmed to have, or Being Evaluated for, COVID-19 Infection Being Cared for in the Home  If you live with, or provide care at home for, a person confirmed to have, or being evaluated for, COVID-19 infection please follow these guidelines to prevent infection:  Follow healthcare providers instructions Make sure that you understand and can help the patient follow any healthcare provider instructions for all care.  Provide for the patients basic needs You should help the patient with basic needs in the home and provide support for getting groceries, prescriptions, and other personal needs.  Monitor the patients symptoms If they are getting sicker, call his or her medical provider and tell them that the patient has, or is being evaluated for, COVID-19 infection. This will help the healthcare providers office  take steps to keep other people from getting infected. Ask the healthcare provider to call the local or state health department.  Limit the number of people who have contact with the patient  If possible, have only one caregiver for the patient.  Other  household members should stay in another home or place of residence. If this is not possible, they should stay  in another room, or be separated from the patient as much as possible. Use a separate bathroom, if available.  Restrict visitors who do not have an essential need to be in the home.  Keep older adults, very young children, and other sick people away from the patient Keep older adults, very young children, and those who have compromised immune systems or chronic health conditions away from the patient. This includes people with chronic heart, lung, or kidney conditions, diabetes, and cancer.  Ensure good ventilation Make sure that shared spaces in the home have good air flow, such as from an air conditioner or an opened window, weather permitting.  Wash your hands often  Wash your hands often and thoroughly with soap and water for at least 20 seconds. You can use an alcohol based hand sanitizer if soap and water are not available and if your hands are not visibly dirty.  Avoid touching your eyes, nose, and mouth with unwashed hands.  Use disposable paper towels to dry your hands. If not available, use dedicated cloth towels and replace them when they become wet.  Wear a facemask and gloves  Wear a disposable facemask at all times in the room and gloves when you touch or have contact with the patients blood, body fluids, and/or secretions or excretions, such as sweat, saliva, sputum, nasal mucus, vomit, urine, or feces.  Ensure the mask fits over your nose and mouth tightly, and do not touch it during use.  Throw out disposable facemasks and gloves after using them. Do not reuse.  Wash your hands immediately after removing your facemask and gloves.  If your personal clothing becomes contaminated, carefully remove clothing and launder. Wash your hands after handling contaminated clothing.  Place all used disposable facemasks, gloves, and other waste in a lined container before  disposing them with other household waste.  Remove gloves and wash your hands immediately after handling these items.  Do not share dishes, glasses, or other household items with the patient  Avoid sharing household items. You should not share dishes, drinking glasses, cups, eating utensils, towels, bedding, or other items with a patient who is confirmed to have, or being evaluated for, COVID-19 infection.  After the person uses these items, you should wash them thoroughly with soap and water.  Wash laundry thoroughly  Immediately remove and wash clothes or bedding that have blood, body fluids, and/or secretions or excretions, such as sweat, saliva, sputum, nasal mucus, vomit, urine, or feces, on them.  Wear gloves when handling laundry from the patient.  Read and follow directions on labels of laundry or clothing items and detergent. In general, wash and dry with the warmest temperatures recommended on the label.  Clean all areas the individual has used often  Clean all touchable surfaces, such as counters, tabletops, doorknobs, bathroom fixtures, toilets, phones, keyboards, tablets, and bedside tables, every day. Also, clean any surfaces that may have blood, body fluids, and/or secretions or excretions on them.  Wear gloves when cleaning surfaces the patient has come in contact with.  Use a diluted bleach solution (e.g., dilute bleach with 1 part  bleach and 10 parts water) or a household disinfectant with a label that says EPA-registered for coronaviruses. To make a bleach solution at home, add 1 tablespoon of bleach to 1 quart (4 cups) of water. For a larger supply, add  cup of bleach to 1 gallon (16 cups) of water.  Read labels of cleaning products and follow recommendations provided on product labels. Labels contain instructions for safe and effective use of the cleaning product including precautions you should take when applying the product, such as wearing gloves or eye protection  and making sure you have good ventilation during use of the product.  Remove gloves and wash hands immediately after cleaning.  Monitor yourself for signs and symptoms of illness Caregivers and household members are considered close contacts, should monitor their health, and will be asked to limit movement outside of the home to the extent possible. Follow the monitoring steps for close contacts listed on the symptom monitoring form.   ? If you have additional questions, contact your local health department or call the epidemiologist on call at [phone] (available 24/7). ? This guidance is subject to change. For the most up-to-date guidance from [facility], please refer to their website: [email or web address]

## 2019-01-31 ENCOUNTER — Other Ambulatory Visit: Payer: Self-pay

## 2020-11-05 ENCOUNTER — Other Ambulatory Visit: Payer: Self-pay | Admitting: *Deleted

## 2020-11-05 DIAGNOSIS — Z1231 Encounter for screening mammogram for malignant neoplasm of breast: Secondary | ICD-10-CM

## 2020-11-29 ENCOUNTER — Ambulatory Visit
Admission: RE | Admit: 2020-11-29 | Discharge: 2020-11-29 | Disposition: A | Discharge status: Home / Self Care | Payer: No Typology Code available for payment source | Source: Ambulatory Visit | Attending: Internal Medicine | Admitting: Internal Medicine

## 2020-11-29 ENCOUNTER — Other Ambulatory Visit: Payer: Self-pay

## 2020-11-29 ENCOUNTER — Other Ambulatory Visit: Payer: Self-pay | Admitting: Family Medicine

## 2020-11-29 DIAGNOSIS — Z1231 Encounter for screening mammogram for malignant neoplasm of breast: Secondary | ICD-10-CM

## 2021-04-22 ENCOUNTER — Ambulatory Visit: Payer: No Typology Code available for payment source | Admitting: Internal Medicine

## 2021-04-22 NOTE — Progress Notes (Deleted)
Name: Meredith Olson  MRN/ DOB: [id number], March 27, 1971   Age/ Sex: 51 y.o., female    PCP: Meredith Lei, MD   Reason for Endocrinology Evaluation: Type 2 Diabetes Mellitus     Date of Initial Endocrinology Visit: 04/22/2021     PATIENT IDENTIFIER: Meredith Olson is a 51 y.o. female with a past medical history of T2DM, Asthma, RLS, fibromyalgia and depression . The patient presented for initial endocrinology clinic visit on 04/22/2021 for consultative assistance with her diabetes management.    HPI: Ms. Meredith Olson was    Diagnosed with DM *** Prior Medications tried/Intolerance: *** Currently checking blood sugars *** x / day,  before breakfast and ***.  Hypoglycemia episodes : ***               Symptoms: ***                 Frequency: ***/  Hemoglobin A1c has ranged from *** in ***, peaking at *** in ***. Patient required assistance for hypoglycemia:  Patient has required hospitalization within the last 1 year from hyper or hypoglycemia:   In terms of diet, the patient ***   HOME DIABETES REGIMEN: Farxiga 10 mg daily  Ozempic 1 mg weekly    Statin: {Yes/No:11203} ACE-I/ARB: {YES/NO:17245} Prior Diabetic Education: {Yes/No:11203}   METER DOWNLOAD SUMMARY: Date range evaluated: *** Fingerstick Blood Glucose Tests = *** Average Number Tests/Day = *** Overall Mean FS Glucose = *** Standard Deviation = ***  BG Ranges: Low = *** High = ***   Hypoglycemic Events/30 Days: BG < 50 = *** Episodes of symptomatic severe hypoglycemia = ***   DIABETIC COMPLICATIONS: Microvascular complications:  *** Denies: *** Last eye exam: Completed   Macrovascular complications:  *** Denies: CAD, PVD, CVA   PAST HISTORY: Past Medical History:  Past Medical History:  Diagnosis Date   Anxiety    Asthma    Depression    Diabetes (HCC)    Fibromyalgia    GERD (gastroesophageal reflux disease)    Headache    migraines   Shortness of breath dyspnea    due to asthma    Past Surgical History:  Past Surgical History:  Procedure Laterality Date   BILATERAL SALPINGECTOMY Bilateral 07/28/2015   Procedure: BILATERAL SALPINGO-OOPHORECTOMY;  Surgeon: Meredith Hamman, MD;  Location: [facility];  Service: Gynecology;  Laterality: Bilateral;   CHOLECYSTECTOMY     CYSTOSCOPY N/A 07/28/2015   Procedure: CYSTOSCOPY;  Surgeon: Meredith Hamman, MD;  Location: [facility];  Service: Gynecology;  Laterality: N/A;   TONSILLECTOMY     VAGINAL HYSTERECTOMY N/A 07/28/2015   Procedure: HYSTERECTOMY VAGINAL;  Surgeon: Meredith Hamman, MD;  Location: [facility];  Service: Gynecology;  Laterality: N/A;  MD needs 1.5hrs OR time    Social History:  reports that she has never smoked. She has never used smokeless tobacco. She reports that she does not drink alcohol and does not use drugs. Family History:  Family History  Problem Relation Age of Onset   Breast cancer Neg Hx      HOME MEDICATIONS: Allergies as of 04/22/2021       Reactions   Latex Itching   Red rash   Aspirin    Asthma flareup   Ibuprofen    Asthma flare up   Shellfish Allergy Hives        Medication List        Accurate as of April 22, 2021  7:08 AM. If you have any questions, ask your nurse or  doctor.          acetaminophen 325 MG tablet Commonly known as: TYLENOL Take 2 tablets (650 mg total) by mouth every 6 (six) hours as needed for mild pain or headache (fever >/= 101).   albuterol 108 (90 Base) MCG/ACT inhaler Commonly known as: VENTOLIN HFA Inhale 1-2 puffs into the lungs every 6 (six) hours as needed for wheezing or shortness of breath.   budesonide-formoterol 160-4.5 MCG/ACT inhaler Commonly known as: SYMBICORT Inhale 2 puffs into the lungs 2 (two) times daily.   cetirizine 10 MG tablet Commonly known as: ZYRTEC Take 10 mg by mouth daily as needed for allergies.   DRY EYES OP Apply 2 drops to eye daily as needed (for allergies or dry eyes).   DULoxetine 60 MG capsule Commonly known as:  CYMBALTA Take 60 mg by mouth daily.   gabapentin 300 MG capsule Commonly known as: NEURONTIN Take 300 mg by mouth 3 (three) times daily. Patient only take it once at night   guaiFENesin-dextromethorphan 100-10 MG/5ML syrup Commonly known as: ROBITUSSIN DM Take 10 mLs by mouth every 6 (six) hours as needed for cough.   ketoconazole 2 % shampoo Commonly known as: NIZORAL Apply 1 application topically 2 (two) times a week.   metFORMIN 500 MG tablet Commonly known as: Glucophage Take 1 tablet (500 mg total) by mouth 2 (two) times daily with a meal.   montelukast 10 MG tablet Commonly known as: SINGULAIR Take 10 mg by mouth at bedtime.   predniSONE 10 MG tablet Commonly known as: DELTASONE Take 20 mg daily for 1 day,then 10 mg daily for 1 day, then stop   Vitamin D 125 MCG (5000 UT) Caps Take 1 capsule by mouth daily.         ALLERGIES: Allergies  Allergen Reactions   Latex Itching    Red rash   Aspirin     Asthma flareup   Ibuprofen     Asthma flare up   Shellfish Allergy Hives     REVIEW OF SYSTEMS: A comprehensive ROS was conducted with the patient and is negative except as per HPI and below:  ROS    OBJECTIVE:   VITAL SIGNS: There were no vitals taken for this visit.   PHYSICAL EXAM:  General: Pt appears well and is in NAD  Hydration: Well-hydrated with moist mucous membranes and good skin turgor  HEENT: Head: Unremarkable with good dentition. Oropharynx clear without exudate.  Eyes: External eye exam normal without stare, lid lag or exophthalmos.  EOM intact.  PERRL.  Neck: General: Supple without adenopathy or carotid bruits. Thyroid: Thyroid size normal.  No goiter or nodules appreciated. No thyroid bruit.  Lungs: Clear with good BS bilat with no rales, rhonchi, or wheezes  Heart: RRR with normal S1 and S2 and no gallops; no murmurs; no rub  Abdomen: Normoactive bowel sounds, soft, nontender, without masses or organomegaly palpable  Extremities:   Lower extremities - No pretibial edema. No lesions.  Skin: Normal texture and temperature to palpation. No rash noted. No Acanthosis nigricans/skin tags. No lipohypertrophy.  Neuro: MS is good with appropriate affect, pt is alert and Ox3    DM foot exam:    DATA REVIEWED:  10/20/2020 BUN/Cr 12/0.7 GFR 104 Na 138 K 4.1 Tg 88 LDL 101 HDL 54 Ma/ cr ratio 13     ASSESSMENT / PLAN / RECOMMENDATIONS:   1) Type 2 Diabetes Mellitus, ***controlled, With*** complications - Most recent A1c of *** %. Goal A1c <  7.0 %.    Plan: GENERAL: ***  MEDICATIONS: ***  EDUCATION / INSTRUCTIONS: BG monitoring instructions: Patient is instructed to check her blood sugars *** times a day, ***. Call [facility] if: BG persistently < 70 or > 300. I reviewed the Rule of 15 for the treatment of hypoglycemia in detail with the patient. Literature supplied.   2) Diabetic complications:  Eye: Does *** have known diabetic retinopathy.  Neuro/ Feet: Does *** have known diabetic peripheral neuropathy. Renal: Patient does *** have known baseline CKD. She is *** on an ACEI/ARB at present.Check urine albumin/creatinine ratio yearly starting at time of diagnosis. If albuminuria is positive, treatment is geared toward better glucose, blood pressure control and use of ACE inhibitors or ARBs. Monitor electrolytes and creatinine once to twice yearly.   3) Lipids: Patient is *** on a statin.    4) Hypertension: ***  at goal of < 140/90 mmHg.       Signed electronically by: Lyndle Herrlich, MD  [facility]  [facility] [address] [place] [place], [place] [address] Phone: [phone] FAX: [phone]   CC: Meredith Lei, MD [address] [place] [address] Phone: [phone]  Fax: [phone]    Return to Endocrinology clinic as below: Future Appointments  Date Time Provider Department Center  04/22/2021  8:10 AM  Herlinda Heady, Konrad Dolores, MD [facility] None

## 2021-06-23 IMAGING — DX DG CHEST 1V PORT
1 series · 1 of 1 positions shown · non-contrast
Comparison: Yesterday

CLINICAL DATA: Pneumonia due to Oxendine

EXAM:
PORTABLE CHEST 1 VIEW

[chest]
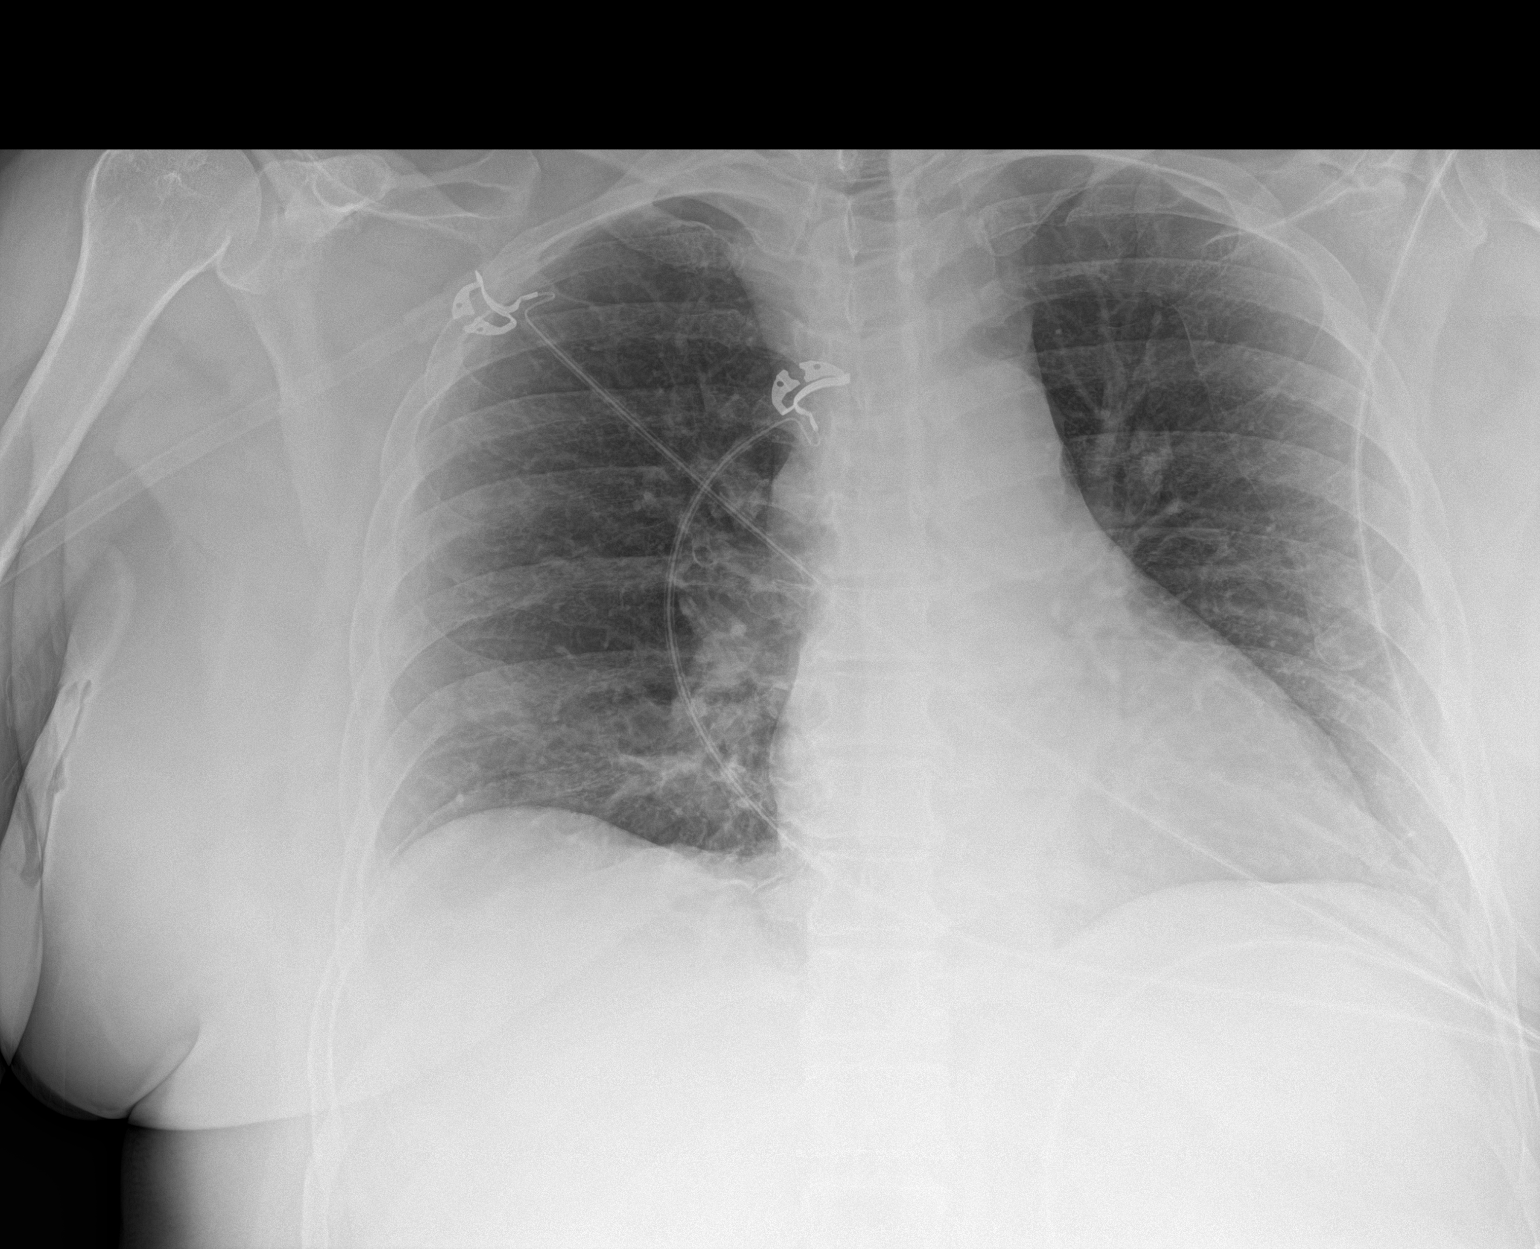

[1 of 1 positions shown; findings below may reference images not displayed]

FINDINGS: Normal heart size and mediastinal contours. No acute infiltrate or
edema. No effusion or pneumothorax. No acute osseous findings.
IMPRESSION: Negative chest.

## 2021-07-04 ENCOUNTER — Encounter: Payer: Self-pay | Admitting: Allergy and Immunology

## 2021-07-04 ENCOUNTER — Ambulatory Visit: Payer: Medicare HMO | Admitting: Allergy and Immunology

## 2021-07-04 VITALS — BP 124/74 | HR 76 | Resp 14 | Ht 66.0 in | Wt 239.4 lb

## 2021-07-04 DIAGNOSIS — R7989 Other specified abnormal findings of blood chemistry: Secondary | ICD-10-CM

## 2021-07-04 DIAGNOSIS — J3089 Other allergic rhinitis: Secondary | ICD-10-CM | POA: Diagnosis not present

## 2021-07-04 DIAGNOSIS — J4540 Moderate persistent asthma, uncomplicated: Secondary | ICD-10-CM

## 2021-07-04 DIAGNOSIS — J301 Allergic rhinitis due to pollen: Secondary | ICD-10-CM

## 2021-07-04 DIAGNOSIS — T781XXA Other adverse food reactions, not elsewhere classified, initial encounter: Secondary | ICD-10-CM | POA: Diagnosis not present

## 2021-07-04 MED ORDER — PATADAY 0.7 % OP SOLN: OPHTHALMIC | 5 refills | Status: AC

## 2021-07-04 MED ORDER — BREZTRI AEROSPHERE 160-9-4.8 MCG/ACT IN AERO: INHALATION_SPRAY | RESPIRATORY_TRACT | 5 refills | Status: DC

## 2021-07-04 MED ORDER — TRIAMCINOLONE ACETONIDE 55 MCG/ACT NA AERO: INHALATION_SPRAY | NASAL | 5 refills | Status: AC

## 2021-07-04 NOTE — Progress Notes (Signed)
?[place] ? ? ?Dear Meredith JabsEmily Olson, ? ?Thank you for referring Meredith FergusonJennifer Olson to the [facility] [place] on 07/04/2021.  ? ?Below is a summation of this patient's evaluation and recommendations. ? ?Thank you for your referral. I will keep you informed about this patient's response to treatment.  ? ?If you have any questions please do not hesitate to contact me.  ? ?Sincerely, ? ?Jessica PriestEric J. Trinton Prewitt, MD ?Allergy / Immunology ? [facility] ? ? ?______________________________________________________________________ ? ? ? ?NEW PATIENT NOTE ? ?Referring Provider: Joice LoftsBrown, Meredith M, NP ?Primary Provider: Joice LoftsBrown, Meredith M, NP ?Date of office visit: 07/04/2021 ?   ?Subjective:  ? ?Chief Complaint:  Meredith FergusonJennifer Olson (DOB: 12-10-1970) is a 51 y.o. female who presents to the clinic on 07/04/2021 with a chief complaint of Allergic Rhinitis  and Asthma ?.    ? ?HPI: Meredith DikeJennifer presents to this clinic in evaluation of allergies and asthma.  I apparently saw her in this clinic several decades ago for a similar issue. ? ?Even while using Symbicort on a daily basis she still has a relatively large requirement for a short acting bronchodilator that can increase up to every day and sometimes she has nocturnal bronchospastic symptoms.  She has wheezing and coughing and shortness of breath.  This occurs on a perennial basis and flares during the spring especially with outdoor exposure.  She has not required a systemic steroid to treat an exacerbation of asthma in greater than a year. ? ?As well, she has nasal congestion and sneezing.  She has tried montelukast which she is not really sure has helped her very much.  She has to be careful about using Flonase for this agent precipitates epistaxis.  Her symptoms occur on a perennial basis and appear to flare during the spring. ? 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 ? ?She had acute hypoxic respiratory failure secondary to COVID infection October 2020 without long-term sequela. ? ?Past Medical History:  ?Diagnosis Date  ? Anxiety   ? Asthma   ? Depression   ? Diabetes (HCC)   ? Fibromyalgia   ? GERD (gastroesophageal reflux disease)   ? Headache   ? migraines  ? Shortness of breath dyspnea   ? due to asthma  ? ? ?Past Surgical History:  ?Procedure Laterality Date  ? BILATERAL SALPINGECTOMY Bilateral 07/28/2015  ? Procedure: BILATERAL SALPINGO-OOPHORECTOMY;  Surgeon: Lavina Hammanodd Meisinger, MD;  Location: [facility];  Service: Gynecology;  Laterality: Bilateral;  ? CHOLECYSTECTOMY    ? CYSTOSCOPY N/A 07/28/2015  ? Procedure: CYSTOSCOPY;  Surgeon: Lavina Hammanodd Meisinger, MD;  Location: [facility];  Service: Gynecology;  Laterality: N/A;  ? TONSILLECTOMY    ? VAGINAL HYSTERECTOMY N/A 07/28/2015  ? Procedure: HYSTERECTOMY VAGINAL;  Surgeon: Lavina Hammanodd Meisinger, MD;  Location: [facility];  Service: Gynecology;  Laterality: N/A;  MD needs 1.5hrs OR time  ? ? ?Allergies as of 07/04/2021   ? ?   Reactions  ? Latex Itching  ? Red rash  ? Aspirin   ? Asthma flareup  ? Ibuprofen   ? Asthma flare up  ? Shellfish Allergy Hives  ? ?  ? ?  ?Medication List  ? ? ?albuterol 108 (90 Base) MCG/ACT inhaler ?Commonly known as: VENTOLIN HFA ?Inhale 1-2 puffs into the lungs every 6 (six) hours as needed for wheezing or shortness of  breath. ?  ?albuterol 1.25 MG/3ML nebulizer solution ?Commonly known as: ACCUNEB ?Take by nebulization. ?  ?budesonide-formoterol 160-4.5 MCG/ACT inhaler ?Commonly known as: SYMBICORT ?Inhale 2 puffs into the lungs 2 (two) times daily. ?  ?busPIRone 5 MG tablet ?Commonly known as: BUSPAR ?Take 5 mg by mouth 2 (two) times daily. ?  ?DULoxetine 60 MG capsule ?Commonly known as: CYMBALTA ?Take 60 mg by mouth daily. ?  ?Farxiga 10 MG Tabs tablet ?Generic drug: dapagliflozin propanediol ?Take 10 mg by mouth daily. ?  ?gabapentin 300 MG capsule ?Commonly  known as: NEURONTIN ?Take 300 mg by mouth 3 (three) times daily. Patient only take it once at night ?  ?hydrOXYzine 25 MG tablet ?Commonly known as: ATARAX ?Take 25 mg by mouth daily as needed. ?  ?ketoconazole 2 % shampoo ?Commonly known as: NIZORAL ?Apply 1 application topically 2 (two) times a week. ?  ?loratadine 10 MG tablet ?Commonly known as: CLARITIN ?Take 10 mg by mouth daily as needed for allergies. ?  ?MAGNESIUM PO ?Take by mouth daily. ?  ?montelukast 10 MG tablet ?Commonly known as: SINGULAIR ?Take 10 mg by mouth at bedtime. ?  ?rOPINIRole 0.25 MG tablet ?Commonly known as: REQUIP ?Take 0.5 mg by mouth at bedtime. ?  ?SUMAtriptan 100 MG tablet ?Commonly known as: IMITREX ?Take 100 mg by mouth daily as needed. ?  ?VITAMIN B 12 PO ?Take by mouth. ?  ?VITAMIN C PO ?Take by mouth. ?  ?VITAMIN D3 PO ?Take by mouth daily. ?  ? ?Review of systems negative except as noted in HPI / PMHx or noted below: ? ?Review of Systems  ?Constitutional: Negative.   ?HENT: Negative.    ?Eyes: Negative.   ?Respiratory: Negative.    ?Cardiovascular: Negative.   ?Gastrointestinal: Negative.   ?Genitourinary: Negative.   ?Musculoskeletal: Negative.   ?Skin: Negative.   ?Neurological: Negative.   ?Endo/Heme/Allergies: Negative.   ?Psychiatric/Behavioral: Negative.    ? ?Family History  ?Problem Relation Age of Onset  ? Atrial fibrillation Mother   ? Diabetes Mother   ? COPD Father   ? Glaucoma Father   ? Lupus Maternal Grandmother   ? Lung cancer Maternal Grandfather   ? Diabetes Paternal Grandmother   ? Melanoma Paternal Grandfather   ? Heart Problems Paternal Grandfather   ? Breast cancer Neg Hx   ? ? ?Social History  ? ?Socioeconomic History  ? Marital status: Married  ?  Spouse name: Not on file  ? Number of children: 3  ? Years of education: Not on file  ? Highest education level: Some college, no degree  ?Occupational History  ? Occupation: Disabled  ?Tobacco Use  ? Smoking status: Never  ?  Passive exposure: Past  ?  Smokeless tobacco: Never  ?Vaping Use  ? Vaping Use: Never used  ?Substance and Sexual Activity  ? Alcohol use: No  ? Drug use: No  ? Sexual activity: Yes  ?Other Topics Concern  ? Not on file  ?Social History Narrative  ? Not on file  ? ?Environmental and Social history ? ?Lives in a house with a dry environment, cat located inside the household, no carpet in the bedroom, no plastic on the bed, no plastic on the pillow, no smoking ongoing with inside the household. ? ?Objective:  ? ?Vitals:  ? 07/04/21 0944  ?BP: 124/74  ?Pulse: 76  ?Resp: 14  ?SpO2: 97%  ? ?Height: 5\' 6"  (167.6 cm) ?Weight: 239 lb 6.4 oz (108.6 kg) ? ?Physical Exam ?Constitutional:   ?  Appearance: She is not diaphoretic.  ?HENT:  ?   Head: Normocephalic.  ?   Right Ear: Tympanic membrane, ear canal and external ear normal.  ?   Left Ear: Tympanic membrane, ear canal and external ear normal.  ?   Nose: Nose normal. No mucosal edema or rhinorrhea.  ?   Mouth/Throat:  ?   Pharynx: Uvula midline. No oropharyngeal exudate.  ?Eyes:  ?   Conjunctiva/sclera: Conjunctivae normal.  ?Neck:  ?   Thyroid: No thyromegaly.  ?   Trachea: Trachea normal. No tracheal tenderness or tracheal deviation.  ?Cardiovascular:  ?   Rate and Rhythm: Normal rate and regular rhythm.  ?   Heart sounds: Normal heart sounds, S1 normal and S2 normal. No murmur heard. ?Pulmonary:  ?   Effort: No respiratory distress.  ?   Breath sounds: Normal breath sounds. No stridor. No wheezing or rales.  ?Lymphadenopathy:  ?   Head:  ?   Right side of head: No tonsillar adenopathy.  ?   Left side of head: No tonsillar adenopathy.  ?   Cervical: No cervical adenopathy.  ?Skin: ?   Findings: No erythema or rash.  ?   Nails: There is no clubbing.  ?Neurological:  ?   Mental Status: She is alert.  ? ? ?Diagnostics: Allergy skin tests were performed.  She demonstrated hypersensitivity to trees, grasses, weeds, house dust mite, cat, dog, crab, oysters, scallops, shellfish mix ? ?Spirometry was  performed and demonstrated an FEV1 of 2.09 @ 73 % of predicted. FEV1/FVC = 0.77 ? ?Results of blood tests obtained 21 January 2019 identifies AST 67 U/L, ALT 121 U/L, WBC 8.2, absolute eosinophils 0, absolute lympho

## 2021-07-04 NOTE — Patient Instructions (Addendum)
?  1.  Allergen avoidance measures - dust mite, cat, dog, pollens, shellfish ? ?2.  Treat and prevent inflammation: ? ?A. Breztri - 2 inhalations 2 times per day w/ spacer (empty lungs) ?B. OTC Rhinocort / Nasacort - 1-2 sprays each nostril 3-7 times per week ? ?3.  If needed: ? ?A. Epi-pen, benadryl, MD/ER evaluation for allergic reaction ?B. Albuterol HFA - 2 inhalations or nebulizer every 4-6 hours ?C. Loratadine 10 mg or cetirizine 10 mg - 1 tablet 1 time per day ?D. Pataday - 1 drop each eye 1 time per day ? ?4.  Consider a course of immunotherapy ? ?5.  Return to clinic in 4 weeks or earlier if problem ? ?6. Elevated liver function tests??? ?

## 2021-07-05 ENCOUNTER — Encounter: Payer: Self-pay | Admitting: Allergy and Immunology

## 2021-08-01 ENCOUNTER — Ambulatory Visit: Payer: Medicare HMO | Admitting: Allergy and Immunology

## 2021-08-01 ENCOUNTER — Encounter: Payer: Self-pay | Admitting: Allergy and Immunology

## 2021-08-01 VITALS — BP 126/82 | HR 78 | Resp 16

## 2021-08-01 DIAGNOSIS — J301 Allergic rhinitis due to pollen: Secondary | ICD-10-CM

## 2021-08-01 DIAGNOSIS — L299 Pruritus, unspecified: Secondary | ICD-10-CM

## 2021-08-01 DIAGNOSIS — J3089 Other allergic rhinitis: Secondary | ICD-10-CM | POA: Diagnosis not present

## 2021-08-01 DIAGNOSIS — T7819XA Other adverse food reactions, not elsewhere classified, initial encounter: Secondary | ICD-10-CM

## 2021-08-01 DIAGNOSIS — J4540 Moderate persistent asthma, uncomplicated: Secondary | ICD-10-CM | POA: Diagnosis not present

## 2021-08-01 DIAGNOSIS — R7989 Other specified abnormal findings of blood chemistry: Secondary | ICD-10-CM

## 2021-08-01 DIAGNOSIS — T781XXA Other adverse food reactions, not elsewhere classified, initial encounter: Secondary | ICD-10-CM

## 2021-08-01 NOTE — Patient Instructions (Addendum)
?  1.  Allergen avoidance measures - dust mite, cat, dog, pollens, shellfish ? ?2.  Treat and prevent inflammation: ? ?A. Breztri - 2 inhalations 2 times per day w/ spacer (empty lungs) ?B. OTC Nasacort - 1-2 sprays each nostril 3-7 times per week ? ?3.  If needed: ? ?A. Epi-pen, benadryl, MD/ER evaluation for allergic reaction ?B. Albuterol HFA - 2 inhalations or nebulizer every 4-6 hours ?C. Loratadine 10 mg or cetirizine 10 mg - 1 tablet 1 time per day ?D. Pataday - 1 drop each eye 1 time per day ? ?4. During visit with primary doctor, obtain CBC w/ differential, IgE, and blood tests and investigation of elevated AST / ALT ? ?5. Biologic agent??? Immunotherapy??? ? ?6. Return to clinic in 12 weeks or earlier if problem ? ? ?

## 2021-08-01 NOTE — Progress Notes (Signed)
? ?[place] - [employer] - [place] - [place] - [place] ? ? ?Follow-up Note ? ?Referring Provider: Joice Lofts, NP ?Primary Provider: Joice Lofts, NP ?Date of Office Visit: 08/01/2021 ? ?Subjective:  ? ?Meredith Olson (DOB: 1970/10/04) is a 51 y.o. female who [facility] in re-evaluation of the following: ? ?HPI: Amri returns to this clinic in reevaluation of asthma, allergic rhinitis, food allergy directed against shellfish.  Her last visit to this clinic was her initial evaluation of 04 July 2021. ? ?She has noticed some improvement while using her triple inhaler and montelukast on a consistent basis and Nasacort about 3 times per week regarding both her upper and lower airways.  She still has a requirement for bronchodilator that is about twice a week and she still gets pretty winded if she exerts herself to any significant amount.  She has definitely had much less nasal congestion and sneezing.  She has not had any epistaxis. ? ?She remains away from consuming shellfish. ? ?She informs me that she has hand itching which has been a longstanding issue.  This involves both the palmar surface and dorsal surface of her hands.  She never sees any dermatitis.  She has tried over-the-counter hydrocortisone which has not helped her.  She tries Benadryl which does help. ? ?Allergies as of 08/01/2021   ? ?   Reactions  ? Latex Itching  ? Red rash  ? Aspirin   ? Asthma flareup  ? Ibuprofen   ? Asthma flare up  ? Shellfish Allergy Hives  ? ?  ? ?  ?Medication List  ? ? ?albuterol 108 (90 Base) MCG/ACT inhaler ?Commonly known as: VENTOLIN HFA ?Inhale 1-2 puffs into the lungs every 6 (six) hours as needed for wheezing or shortness of breath. ?  ?albuterol 1.25 MG/3ML nebulizer solution ?Commonly known as: ACCUNEB ?Take by nebulization. ?  ?Breztri Aerosphere 160-9-4.8 MCG/ACT Aero ?Generic drug: Budeson-Glycopyrrol-Formoterol ?Inhale two puffs with spacer twice daily to  prevent cough or wheeze.  Rinse, gargle, and spit after use. ?  ?busPIRone 5 MG tablet ?Commonly known as: BUSPAR ?Take 5 mg by mouth 2 (two) times daily. ?  ?dapagliflozin propanediol 10 MG Tabs tablet ?Commonly known as: FARXIGA ?Take 10 mg by mouth daily. ?  ?DULoxetine 60 MG capsule ?Commonly known as: CYMBALTA ?Take 60 mg by mouth daily. ?  ?gabapentin 300 MG capsule ?Commonly known as: NEURONTIN ?Take 300 mg by mouth 3 (three) times daily. Patient only take it once at night ?  ?hydrOXYzine 25 MG tablet ?Commonly known as: ATARAX ?Take 25 mg by mouth daily as needed. ?  ?Jardiance 25 MG Tabs tablet ?Generic drug: empagliflozin ?Take 25 mg by mouth daily. ?  ?ketoconazole 2 % shampoo ?Commonly known as: NIZORAL ?Apply 1 application topically 2 (two) times a week. ?  ?loratadine 10 MG tablet ?Commonly known as: CLARITIN ?Take 10 mg by mouth daily as needed for allergies. ?  ?MAGNESIUM PO ?Take by mouth daily. ?  ?montelukast 10 MG tablet ?Commonly known as: SINGULAIR ?Take 10 mg by mouth at bedtime. ?  ?Pataday 0.7 % Soln ?Generic drug: Olopatadine HCl ?Can use one drop in each eye once daily if needed for red, itchy, watery eyes. ?  ?rOPINIRole 0.25 MG tablet ?Commonly known as: REQUIP ?Take 0.5 mg by mouth at bedtime. ?  ?SUMAtriptan 100 MG tablet ?Commonly known as: IMITREX ?Take 100 mg by mouth daily as needed. ?  ?triamcinolone 55 MCG/ACT  Aero nasal inhaler ?Commonly known as: Nasacort Allergy 24HR ?Can use one to two sprays in each nostril three to seven times per week. ?  ?VITAMIN B 12 PO ?Take by mouth. ?  ?VITAMIN C PO ?Take by mouth. ?  ?VITAMIN D3 PO ?Take by mouth daily. ?  ? ?  ? ? ?Past Medical History:  ?Diagnosis Date  ? Anxiety   ? Asthma   ? Depression   ? Diabetes (HCC)   ? Fibromyalgia   ? GERD (gastroesophageal reflux disease)   ? Headache   ? migraines  ? Shortness of breath dyspnea   ? due to asthma  ? ? ?Past Surgical History:  ?Procedure Laterality Date  ? BILATERAL SALPINGECTOMY  Bilateral 07/28/2015  ? Procedure: BILATERAL SALPINGO-OOPHORECTOMY;  Surgeon: Lavina Hammanodd Meisinger, MD;  Location: [facility];  Service: Gynecology;  Laterality: Bilateral;  ? CHOLECYSTECTOMY    ? CYSTOSCOPY N/A 07/28/2015  ? Procedure: CYSTOSCOPY;  Surgeon: Lavina Hammanodd Meisinger, MD;  Location: [facility];  Service: Gynecology;  Laterality: N/A;  ? TONSILLECTOMY    ? VAGINAL HYSTERECTOMY N/A 07/28/2015  ? Procedure: HYSTERECTOMY VAGINAL;  Surgeon: Lavina Hammanodd Meisinger, MD;  Location: [facility];  Service: Gynecology;  Laterality: N/A;  MD needs 1.5hrs OR time  ? ? ?Review of systems negative except as noted in HPI / PMHx or noted below: ? ?Review of Systems  ?Constitutional: Negative.   ?HENT: Negative.    ?Eyes: Negative.   ?Respiratory: Negative.    ?Cardiovascular: Negative.   ?Gastrointestinal: Negative.   ?Genitourinary: Negative.   ?Musculoskeletal: Negative.   ?Skin: Negative.   ?Neurological: Negative.   ?Endo/Heme/Allergies: Negative.   ?Psychiatric/Behavioral: Negative.    ? ? ?Objective:  ? ?Vitals:  ? 08/01/21 1021  ?BP: 126/82  ?Pulse: 78  ?Resp: 16  ?SpO2: 95%  ? ?   ?   ? ?Physical Exam ?Constitutional:   ?   Appearance: She is not diaphoretic.  ?HENT:  ?   Head: Normocephalic.  ?   Right Ear: Tympanic membrane, ear canal and external ear normal.  ?   Left Ear: Tympanic membrane, ear canal and external ear normal.  ?   Nose: Nose normal. No mucosal edema or rhinorrhea.  ?   Mouth/Throat:  ?   Pharynx: Uvula midline. No oropharyngeal exudate.  ?Eyes:  ?   Conjunctiva/sclera: Conjunctivae normal.  ?Neck:  ?   Thyroid: No thyromegaly.  ?   Trachea: Trachea normal. No tracheal tenderness or tracheal deviation.  ?Cardiovascular:  ?   Rate and Rhythm: Normal rate and regular rhythm.  ?   Heart sounds: Normal heart sounds, S1 normal and S2 normal. No murmur heard. ?Pulmonary:  ?   Effort: No respiratory distress.  ?   Breath sounds: Normal breath sounds. No stridor. No wheezing or rales.  ?Lymphadenopathy:  ?   Head:  ?   Right side of  head: No tonsillar adenopathy.  ?   Left side of head: No tonsillar adenopathy.  ?   Cervical: No cervical adenopathy.  ?Skin: ?   Findings: No erythema or rash.  ?   Nails: There is no clubbing.  ?Neurological:  ?   Mental Status: She is alert.  ? ? ?Diagnostics:  ?  ?Spirometry was performed and demonstrated an FEV1 of 2.28 at 79 % of predicted. ? ?Assessment and Plan:  ? ?1. Not well controlled moderate persistent asthma   ?2. Perennial allergic rhinitis   ?3. Seasonal allergic rhinitis due to pollen   ?4. Adverse  food reaction, initial encounter   ?5. Elevated liver function tests   ?6. Pruritic disorder   ? ? ?1.  Allergen avoidance measures - dust mite, cat, dog, pollens, shellfish ? ?2.  Treat and prevent inflammation: ? ?A. Breztri - 2 inhalations 2 times per day w/ spacer (empty lungs) ?B. OTC Nasacort - 1-2 sprays each nostril 3-7 times per week ? ?3.  If needed: ? ?A. Epi-pen, benadryl, MD/ER evaluation for allergic reaction ?B. Albuterol HFA - 2 inhalations or nebulizer every 4-6 hours ?C. Loratadine 10 mg or cetirizine 10 mg - 1 tablet 1 time per day ?D. Pataday - 1 drop each eye 1 time per day ? ?4. During visit with primary doctor, obtain CBC w/ differential, IgE, and blood tests and investigation of elevated AST / ALT ? ?5. Biologic agent??? Immunotherapy??? ? ?6. Return to clinic in 12 weeks or earlier if problem ? ?Kyllie has shown some improvement on her current therapy which includes allergen avoidance measures and anti-inflammatory agents for her upper and lower airway.  But, she does not feel that she is at a point where her atopic inflammatory disease is under good control.  We will see if she is a candidate for a biologic agent by checking her blood tests as noted above.  As well, she would be a candidate for immunotherapy and we will discuss that once we have the results of her blood test available for review.  She is going to visit with her primary doctor next week and I have asked her  obtain a CBC with differential and serum IgE level during that visit.  As well, she should have further evaluation for her elevated AST and ALT that was documented in 2020.  I could not find any additional testing

## 2021-08-02 ENCOUNTER — Encounter: Payer: Self-pay | Admitting: Allergy and Immunology

## 2021-09-19 ENCOUNTER — Telehealth: Payer: Self-pay | Admitting: Allergy and Immunology

## 2021-09-19 NOTE — Telephone Encounter (Signed)
Informed patient that all the available alternatives on her formulary were the sample Tier 3 as the Brezti.  I told her we do have some samples here for her.  She is planning to fill out the [employer] Patient Assistance Form for [place] and will bring it by the office for [name] to complete and fax in.

## 2021-09-19 NOTE — Telephone Encounter (Signed)
Patient states Meredith Olson is costing her $47 and she will run out today. She is wondering if there were any samples, an assistance program or a cheaper alternative that she can use.   Best pharmacy- [facility] in [place]

## 2021-09-19 NOTE — Telephone Encounter (Signed)
Symbicort and Spiriva are both Tier 3.

## 2021-09-19 NOTE — Telephone Encounter (Signed)
Looked up patient's drug formulary.  Looks like Meredith Olson is Tier 3.  Telegy is another alternative, but it is also Tier 3.  Would you like to try Trelegy to see if it is possibly cheaper?  Please advise.

## 2021-09-19 NOTE — Telephone Encounter (Signed)
Patient has [facility], so I do not think Co-Pay Coupon will work.  Unfortunately.

## 2021-10-31 ENCOUNTER — Encounter: Payer: Self-pay | Admitting: Allergy and Immunology

## 2021-10-31 ENCOUNTER — Ambulatory Visit: Payer: Medicare HMO | Admitting: Allergy and Immunology

## 2021-10-31 VITALS — BP 102/72 | HR 68 | Resp 16

## 2021-10-31 DIAGNOSIS — J4550 Severe persistent asthma, uncomplicated: Secondary | ICD-10-CM | POA: Diagnosis not present

## 2021-10-31 DIAGNOSIS — J301 Allergic rhinitis due to pollen: Secondary | ICD-10-CM

## 2021-10-31 DIAGNOSIS — D7219 Other eosinophilia: Secondary | ICD-10-CM

## 2021-10-31 DIAGNOSIS — J3089 Other allergic rhinitis: Secondary | ICD-10-CM | POA: Diagnosis not present

## 2021-10-31 DIAGNOSIS — T781XXD Other adverse food reactions, not elsewhere classified, subsequent encounter: Secondary | ICD-10-CM

## 2021-10-31 MED ORDER — EPINEPHRINE 0.3 MG/0.3ML IJ SOAJ: INTRAMUSCULAR | 3 refills | Status: DC

## 2021-10-31 NOTE — Patient Instructions (Addendum)
  1.  Allergen avoidance measures - dust mite, cat, dog, pollens, shellfish  2.  Treat and prevent inflammation:  A. Breztri - 2 inhalations 2 times per day w/ spacer (empty lungs) B. OTC Nasacort - 1-2 sprays each nostril 3-7 times per week  3.  If needed:  A. Epi-pen, benadryl, MD/ER evaluation for allergic reaction B. Albuterol HFA - 2 inhalations or nebulizer every 4-6 hours C. Loratadine 10 mg or cetirizine 10 mg - 1 tablet 1 time per day D. Pataday - 1 drop each eye 1 time per day  4. Biologic agent??? Immunotherapy???  5. Return to clinic in 6 months or earlier if problem  6. Obtain fall flu vaccine

## 2021-10-31 NOTE — Progress Notes (Unsigned)
[place] [facility] [place] [facility] [place] - [place] - [place]   Follow-up Note  Referring Provider: Joice Lofts, NP Primary Provider: Joice Lofts, NP Date of Office Visit: 10/31/2021  Subjective:   Meredith Olson (DOB: 05-28-1970) is a 51 y.o. female who [facility] in re-evaluation of the following:  HPI: Toyia returns to this clinic in reevaluation of asthma, allergic rhinitis, food allergy directed against shellfish.  I last saw in this clinic on 01 Aug 2021.  She is really doing very well at this point in time regarding her asthma without the need for short acting bronchodilator and no need to use a systemic steroid or an antibiotic for any type of airway issue while she continues to use her triple inhaler on a consistent basis and occasionally some nasal steroids.  She does not consume shellfish.  Allergies as of 10/31/2021       Reactions   Latex Itching   Red rash   Aspirin    Asthma flareup   Ibuprofen    Asthma flare up   Mounjaro [tirzepatide] Diarrhea, Nausea And Vomiting   Shellfish Allergy Hives        Medication List    albuterol 108 (90 Base) MCG/ACT inhaler Commonly known as: VENTOLIN HFA Inhale 1-2 puffs into the lungs every 6 (six) hours as needed for wheezing or shortness of breath.   albuterol 1.25 MG/3ML nebulizer solution Commonly known as: ACCUNEB Take by nebulization.   Breztri Aerosphere 160-9-4.8 MCG/ACT Aero Generic drug: Budeson-Glycopyrrol-Formoterol Inhale two puffs with spacer twice daily to prevent cough or wheeze.  Rinse, gargle, and spit after use.   busPIRone 5 MG tablet Commonly known as: BUSPAR Take 5 mg by mouth 2 (two) times daily.   DULoxetine 60 MG capsule Commonly known as: CYMBALTA Take 60 mg by mouth daily.   gabapentin 300 MG capsule Commonly known as: NEURONTIN Take 300 mg by mouth 3 (three) times daily. Patient only take it once at night   hydrOXYzine 25 MG  tablet Commonly known as: ATARAX Take 25 mg by mouth daily as needed.   ketoconazole 2 % shampoo Commonly known as: NIZORAL Apply 1 application topically 2 (two) times a week.   loratadine 10 MG tablet Commonly known as: CLARITIN Take 10 mg by mouth daily as needed for allergies.   MAGNESIUM PO Take by mouth daily.   Pataday 0.7 % Soln Generic drug: Olopatadine HCl Can use one drop in each eye once daily if needed for red, itchy, watery eyes.   rOPINIRole 0.25 MG tablet Commonly known as: REQUIP Take 0.5 mg by mouth at bedtime.   SUMAtriptan 100 MG tablet Commonly known as: IMITREX Take 100 mg by mouth daily as needed.   triamcinolone 55 MCG/ACT Aero nasal inhaler Commonly known as: Nasacort Allergy 24HR Can use one to two sprays in each nostril three to seven times per week.   VITAMIN C PO Take by mouth.   VITAMIN D3 PO Take by mouth daily.    Past Medical History:  Diagnosis Date   Anxiety    Asthma    Depression    Diabetes (HCC)    Fibromyalgia    GERD (gastroesophageal reflux disease)    Headache    migraines   Shortness of breath dyspnea    due to asthma    Past Surgical History:  Procedure Laterality Date   BILATERAL SALPINGECTOMY Bilateral 07/28/2015   Procedure: BILATERAL SALPINGO-OOPHORECTOMY;  Surgeon: Tawanna Cooler  Meisinger, MD;  Location: [facility];  Service: Gynecology;  Laterality: Bilateral;   CHOLECYSTECTOMY     CYSTOSCOPY N/A 07/28/2015   Procedure: CYSTOSCOPY;  Surgeon: Lavina Hamman, MD;  Location: [facility];  Service: Gynecology;  Laterality: N/A;   TONSILLECTOMY     VAGINAL HYSTERECTOMY N/A 07/28/2015   Procedure: HYSTERECTOMY VAGINAL;  Surgeon: Lavina Hamman, MD;  Location: [facility];  Service: Gynecology;  Laterality: N/A;  MD needs 1.5hrs OR time    Review of systems negative except as noted in HPI / PMHx or noted below:  Review of Systems  Constitutional: Negative.   HENT: Negative.    Eyes: Negative.   Respiratory: Negative.     Cardiovascular: Negative.   Gastrointestinal: Negative.   Genitourinary: Negative.   Musculoskeletal: Negative.   Skin: Negative.   Neurological: Negative.   Endo/Heme/Allergies: Negative.   Psychiatric/Behavioral: Negative.       Objective:   Vitals:   10/31/21 1035  BP: 102/72  Pulse: 68  Resp: 16  SpO2: 98%          Physical Exam Constitutional:      Appearance: She is not diaphoretic.  HENT:     Head: Normocephalic.     Right Ear: Tympanic membrane, ear canal and external ear normal.     Left Ear: Tympanic membrane, ear canal and external ear normal.     Nose: Nose normal. No mucosal edema or rhinorrhea.     Mouth/Throat:     Pharynx: Uvula midline. No oropharyngeal exudate.  Eyes:     Conjunctiva/sclera: Conjunctivae normal.  Neck:     Thyroid: No thyromegaly.     Trachea: Trachea normal. No tracheal tenderness or tracheal deviation.  Cardiovascular:     Rate and Rhythm: Normal rate and regular rhythm.     Heart sounds: Normal heart sounds, S1 normal and S2 normal. No murmur heard. Pulmonary:     Effort: No respiratory distress.     Breath sounds: Normal breath sounds. No stridor. No wheezing or rales.  Lymphadenopathy:     Head:     Right side of head: No tonsillar adenopathy.     Left side of head: No tonsillar adenopathy.     Cervical: No cervical adenopathy.  Skin:    Findings: No erythema or rash.     Nails: There is no clubbing.  Neurological:     Mental Status: She is alert.     Diagnostics: Results of blood tests obtained 09 Aug 2021 identifies WBC 10.7, absolute eosinophil 400, absolute lymphocyte 3400, hemoglobin 15.9, AST 17 U/L, ALT 24 U/L, IgE 1377 KU/L   Spirometry was performed and demonstrated an FEV1 of 2.29 at 80 % of predicted.  Assessment and Plan:   1. Not well controlled severe persistent asthma   2. Perennial allergic rhinitis   3. Seasonal allergic rhinitis due to pollen   4. Adverse food reaction, subsequent encounter      1.  Allergen avoidance measures - dust mite, cat, dog, pollens, shellfish  2.  Treat and prevent inflammation:  A. Breztri - 2 inhalations 2 times per day w/ spacer (empty lungs) B. OTC Nasacort - 1-2 sprays each nostril 3-7 times per week  3.  If needed:  A. Epi-pen, benadryl, MD/ER evaluation for allergic reaction B. Albuterol HFA - 2 inhalations or nebulizer every 4-6 hours C. Loratadine 10 mg or cetirizine 10 mg - 1 tablet 1 time per day D. Pataday - 1 drop each eye 1 time per day  4. Biologic agent??? Immunotherapy???  5. Return to clinic in 6 months or earlier if problem  6. Obtain fall flu vaccine  At this point in time Kathreen is stable regarding her multiorgan atopic disease and she will continue to use the plan of action noted above which includes allergen avoidance measures as best as possible and a collection of anti-inflammatory agents for her airway.  She will need to go through each season of the year to see if she can maintain good control of her atopic disease.  She is definitely a candidate for immunotherapy and we have offered her this form of therapy.  We may need to start her on a biologic agent should she lose control of her atopic disease.   Laurette Schimke, MD Allergy / Immunology [facility]

## 2021-11-01 ENCOUNTER — Encounter: Payer: Self-pay | Admitting: Allergy and Immunology

## 2021-11-08 ENCOUNTER — Encounter: Payer: Self-pay | Admitting: *Deleted

## 2022-04-11 ENCOUNTER — Other Ambulatory Visit: Payer: Self-pay | Admitting: *Deleted

## 2022-04-11 MED ORDER — BREZTRI AEROSPHERE 160-9-4.8 MCG/ACT IN AERO: INHALATION_SPRAY | RESPIRATORY_TRACT | 0 refills | Status: DC

## 2022-04-12 DIAGNOSIS — M9902 Segmental and somatic dysfunction of thoracic region: Secondary | ICD-10-CM | POA: Diagnosis not present

## 2022-04-12 DIAGNOSIS — E1165 Type 2 diabetes mellitus with hyperglycemia: Secondary | ICD-10-CM | POA: Diagnosis not present

## 2022-04-12 DIAGNOSIS — Z7984 Long term (current) use of oral hypoglycemic drugs: Secondary | ICD-10-CM | POA: Diagnosis not present

## 2022-04-12 DIAGNOSIS — M5413 Radiculopathy, cervicothoracic region: Secondary | ICD-10-CM | POA: Diagnosis not present

## 2022-04-12 DIAGNOSIS — M9903 Segmental and somatic dysfunction of lumbar region: Secondary | ICD-10-CM | POA: Diagnosis not present

## 2022-04-12 DIAGNOSIS — Z794 Long term (current) use of insulin: Secondary | ICD-10-CM | POA: Diagnosis not present

## 2022-04-12 DIAGNOSIS — M9901 Segmental and somatic dysfunction of cervical region: Secondary | ICD-10-CM | POA: Diagnosis not present

## 2022-04-13 DIAGNOSIS — H903 Sensorineural hearing loss, bilateral: Secondary | ICD-10-CM | POA: Diagnosis not present

## 2022-04-13 DIAGNOSIS — H9209 Otalgia, unspecified ear: Secondary | ICD-10-CM | POA: Diagnosis not present

## 2022-04-13 DIAGNOSIS — H9313 Tinnitus, bilateral: Secondary | ICD-10-CM | POA: Diagnosis not present

## 2022-04-13 DIAGNOSIS — H9193 Unspecified hearing loss, bilateral: Secondary | ICD-10-CM | POA: Diagnosis not present

## 2022-04-14 DIAGNOSIS — M9901 Segmental and somatic dysfunction of cervical region: Secondary | ICD-10-CM | POA: Diagnosis not present

## 2022-04-14 DIAGNOSIS — M9902 Segmental and somatic dysfunction of thoracic region: Secondary | ICD-10-CM | POA: Diagnosis not present

## 2022-04-14 DIAGNOSIS — M5413 Radiculopathy, cervicothoracic region: Secondary | ICD-10-CM | POA: Diagnosis not present

## 2022-04-14 DIAGNOSIS — M9903 Segmental and somatic dysfunction of lumbar region: Secondary | ICD-10-CM | POA: Diagnosis not present

## 2022-04-26 ENCOUNTER — Ambulatory Visit: Payer: Medicare HMO | Admitting: Orthopaedic Surgery

## 2022-04-26 ENCOUNTER — Ambulatory Visit (INDEPENDENT_AMBULATORY_CARE_PROVIDER_SITE_OTHER): Payer: Medicare HMO

## 2022-04-26 ENCOUNTER — Encounter: Payer: Self-pay | Admitting: Orthopaedic Surgery

## 2022-04-26 DIAGNOSIS — M25552 Pain in left hip: Secondary | ICD-10-CM

## 2022-04-26 DIAGNOSIS — M25551 Pain in right hip: Secondary | ICD-10-CM

## 2022-04-26 NOTE — Progress Notes (Signed)
Office Visit Note   Patient: Meredith Olson           Date of Birth: July 27, 1970           MRN: [id number] Visit Date: 04/26/2022              Requested by: Earna Coder, NP [address] [place],  [place] [address] PCP: Earna Coder, NP   Assessment & Plan: Visit Diagnoses:  1. Bilateral hip pain     Plan: Impression is chronic right hip and back pain.  At this point, would like to start the patient in outpatient physical therapy for her hip and back.  Referral was made.  Would like to also order hip injection with Dr. Rolena Infante in addition to an MRI of the lumbar spine.  She will follow-up with [name] once the MRIs been completed.  Call with concerns or questions.  Follow-Up Instructions: Return for f/u after mri.   Orders:  Orders Placed This Encounter  Procedures   XR HIPS BILAT W OR W/O PELVIS 3-4 VIEWS   XR Lumbar Spine 2-3 Views   Ambulatory referral to Physical Therapy   No orders of the defined types were placed in this encounter.     Procedures: No procedures performed   Clinical Data: No additional findings.   Subjective: Chief Complaint  Patient presents with   Right Hip - Pain   Left Hip - Pain   Lower Back - Pain    HPI patient is a 52 year old female who comes in today with right lateral hip, groin and right-sided back pain for the past 4 to 5 years which has progressively worsened.  She notes occasional pain and paresthesias distal to the knee and into the foot at times.  She has associated weakness to the right leg with occasional locking in the right hip.  Symptoms are worse when she is walking, standing, sitting too long as well as when she goes from seated to standing position.  She has been taking Tylenol and gabapentin without relief.  She does note tingling in the entire leg at times.  No bowel or bladder change or saddle paresthesias.  No history of right hip injection.  She does note she has a remote history of a herniated disc in the lower  back and has undergone what sounds like epidural steroid injection at some point this year.  Review of Systems as detailed in HPI.  All others reviewed and are negative.   Objective: Vital Signs: There were no vitals taken for this visit.  Physical Exam well-developed well-nourished female no acute distress.  Alert and oriented x 3.  Ortho Exam right hip exam reveals negative logroll.  Pain with FADIR and [place].  Positive straight leg raise.  No pain with lumbar flexion or extension.  She is neurovascular intact distally.  Specialty Comments:  No specialty comments available.  Imaging: XR Lumbar Spine 2-3 Views  Result Date: 04/26/2022 Thoracolumbar scoliosis with multilevel DDD  XR HIPS BILAT W OR W/O PELVIS 3-4 VIEWS  Result Date: 04/26/2022 No acute or structural abnormalities    PMFS History: Patient Active Problem List   Diagnosis Date Noted   Acute respiratory failure with hypoxia ([place]) 01/15/2019   Transaminitis 01/15/2019   Viral pneumonia 01/09/2019   Obesity (BMI 35.0-39.9 without comorbidity) 01/09/2019   Respiratory failure with hypoxia ([place]) 01/09/2019   Pneumonia due to COVID-19 virus 01/08/2019   S/P vaginal hysterectomy 07/28/2015   Past Medical History:  Diagnosis  Date   Anxiety    Asthma    Depression    Diabetes (HCC)    Fibromyalgia    GERD (gastroesophageal reflux disease)    Headache    migraines   Shortness of breath dyspnea    due to asthma    Family History  Problem Relation Age of Onset   Atrial fibrillation Mother    Diabetes Mother    COPD Father    Glaucoma Father    Lupus Maternal Grandmother    Lung cancer Maternal Grandfather    Diabetes Paternal Grandmother    Melanoma Paternal Grandfather    Heart Problems Paternal Grandfather    Breast cancer Neg Hx     Past Surgical History:  Procedure Laterality Date   BILATERAL SALPINGECTOMY Bilateral 07/28/2015   Procedure: BILATERAL SALPINGO-OOPHORECTOMY;  Surgeon: Cheri Fowler, MD;  Location: [facility];  Service: Gynecology;  Laterality: Bilateral;   CHOLECYSTECTOMY     CYSTOSCOPY N/A 07/28/2015   Procedure: CYSTOSCOPY;  Surgeon: Cheri Fowler, MD;  Location: [facility];  Service: Gynecology;  Laterality: N/A;   TONSILLECTOMY     VAGINAL HYSTERECTOMY N/A 07/28/2015   Procedure: HYSTERECTOMY VAGINAL;  Surgeon: Cheri Fowler, MD;  Location: [facility];  Service: Gynecology;  Laterality: N/A;  MD needs 1.5hrs OR time   Social History   Occupational History   Occupation: Disabled  Tobacco Use   Smoking status: Never    Passive exposure: Past   Smokeless tobacco: Never  Vaping Use   Vaping Use: Never used  Substance and Sexual Activity   Alcohol use: No   Drug use: No   Sexual activity: Yes

## 2022-04-27 ENCOUNTER — Ambulatory Visit: Payer: Medicare HMO | Admitting: Sports Medicine

## 2022-04-27 ENCOUNTER — Ambulatory Visit: Payer: Self-pay

## 2022-04-27 ENCOUNTER — Encounter: Payer: Self-pay | Admitting: Sports Medicine

## 2022-04-27 DIAGNOSIS — M25551 Pain in right hip: Secondary | ICD-10-CM

## 2022-04-27 MED ORDER — METHYLPREDNISOLONE ACETATE 40 MG/ML IJ SUSP
40.0000 mg | INTRAMUSCULAR | Status: AC | PRN
  Administered 2022-04-27: 40 mg via INTRA_ARTICULAR

## 2022-04-27 MED ORDER — LIDOCAINE HCL 1 % IJ SOLN
4.0000 mL | INTRAMUSCULAR | Status: AC | PRN
  Administered 2022-04-27: 4 mL

## 2022-04-27 NOTE — Progress Notes (Signed)
   Procedure Note  Patient: Meredith Olson             Date of Birth: May 17, 1970           MRN: [id number]             Visit Date: 04/27/2022  Procedures: Visit Diagnoses:  1. Pain in right hip    Large Joint Inj: R hip joint on 04/27/2022 8:26 AM Indications: pain Details: 22 G 3.5 in needle, ultrasound-guided anterior approach Medications: 4 mL lidocaine 1 %; 40 mg methylPREDNISolone acetate 40 MG/ML Outcome: tolerated well, no immediate complications  Procedure: US-guided intra-articular hip injection, right After discussion on risks/benefits/indications and informed verbal consent was obtained, a timeout was performed. Patient was lying supine on exam table. The hip was cleaned with betadine and alcohol swabs. Then utilizing ultrasound guidance, the patient's femoral head and neck junction was identified and subsequently injected with 4:1 lidocaine:depomedrol via an in-plane approach with ultrasound visualization of the injectate administered into the hip joint. Patient tolerated procedure well without immediate complications.  Procedure, treatment alternatives, risks and benefits explained, specific risks discussed. Consent was given by the patient. Immediately prior to procedure a time out was called to verify the correct patient, procedure, equipment, support staff and site/side marked as required. Patient was prepped and draped in the usual sterile fashion.     - I evaluated the patient about 10 minutes post-injection and she had some improvement in pain and range of motion - follow-up with Dr. Erlinda Hong as indicated; I am happy to see them as needed  Elba Barman, DO [facility]  This note was dictated using Dragon naturally speaking software and may contain errors in syntax, spelling, or content which have not been identified prior to signing this note.

## 2022-04-29 ENCOUNTER — Other Ambulatory Visit: Payer: Self-pay | Admitting: Allergy and Immunology

## 2022-05-03 ENCOUNTER — Ambulatory Visit: Payer: Medicare HMO | Admitting: Allergy and Immunology

## 2022-05-03 ENCOUNTER — Encounter: Payer: Self-pay | Admitting: Allergy and Immunology

## 2022-05-03 VITALS — BP 114/72 | HR 80 | Resp 16 | Ht 66.0 in | Wt 244.8 lb

## 2022-05-03 DIAGNOSIS — T781XXD Other adverse food reactions, not elsewhere classified, subsequent encounter: Secondary | ICD-10-CM

## 2022-05-03 DIAGNOSIS — J301 Allergic rhinitis due to pollen: Secondary | ICD-10-CM

## 2022-05-03 DIAGNOSIS — J3089 Other allergic rhinitis: Secondary | ICD-10-CM | POA: Diagnosis not present

## 2022-05-03 DIAGNOSIS — J4550 Severe persistent asthma, uncomplicated: Secondary | ICD-10-CM | POA: Diagnosis not present

## 2022-05-03 MED ORDER — AIRSUPRA 90-80 MCG/ACT IN AERO: 2.0000 | INHALATION_SPRAY | RESPIRATORY_TRACT | 2 refills | Status: DC

## 2022-05-03 NOTE — Patient Instructions (Signed)
  1.  Allergen avoidance measures - dust mite, cat, dog, pollens, shellfish  2.  Treat and prevent inflammation:  A. Breztri - 2 inhalations 1-2 times per day w/ spacer (empty lungs) B. OTC Nasacort - 1-2 sprays each nostril 3-7 times per week  3.  If needed:  A. Epi-pen, benadryl, MD/ER evaluation for allergic reaction B. AIRSUPRA - 2 inhalations or nebulizer every 4-6 hours C. Loratadine 10 mg or cetirizine 10 mg - 1 tablet 1 time per day D. Pataday - 1 drop each eye 1 time per day  4. Return to clinic in 6 months or earlier if problem

## 2022-05-03 NOTE — Progress Notes (Unsigned)
Meredith Olson   Follow-up Note  Referring Provider: Earna Coder, NP Primary Provider: Earna Coder, NP Date of Office Visit: 05/03/2022  Subjective:   Meredith Olson (DOB: 03/03/1971) is a 52 y.o. female who [facility] to [facility] on 05/03/2022 in re-evaluation of the following:  HPI: Meredith Olson returns to this clinic in evaluation of asthma, allergic rhinitis, food allergy directed against shellfish.  I last saw her in this clinic 31 October 2021.  Her asthma has been under excellent control and she has not required any type of therapy for an exacerbation and she rarely uses a short acting bronchodilator while she continues on [place] mostly 1 time per day.  She has not contracted one of our very prevalent viral infections that have been located in the community the past several months.  Her nose is doing very well.  She intermittently uses a nasal steroid.  She has not required an antibiotic to treat an episode of sinusitis.  She remains away from consumption of shellfish.  She did receive the flu vaccine this year.  Allergies as of 05/03/2022       Reactions   Latex Itching   Red rash   Aspirin    Asthma flareup   Ibuprofen    Asthma flare up   Mounjaro [tirzepatide] Diarrhea, Nausea And Vomiting   Shellfish Allergy Hives        Medication List    albuterol 108 (90 Base) MCG/ACT inhaler Commonly known as: VENTOLIN HFA Inhale 1-2 puffs into the lungs every 6 (six) hours as needed for wheezing or shortness of breath.   albuterol 1.25 MG/3ML nebulizer solution Commonly known as: ACCUNEB Take by nebulization.   Breztri Aerosphere 160-9-4.8 MCG/ACT Aero Generic drug: Budeson-Glycopyrrol-Formoterol INHALE TWO PUFFS WITH SPACER TWICE DAILY TO PREVENT COUGH OR WHEEZE. RINSE, GARGLE, AND SPIT AFTER USE.   busPIRone 5 MG tablet Commonly known as: BUSPAR Take 5 mg by mouth 2 (two) times daily.    DULoxetine 60 MG capsule Commonly known as: CYMBALTA Take 60 mg by mouth daily.   EPINEPHrine 0.3 mg/0.3 mL Soaj injection Commonly known as: EpiPen 2-Pak Use as directed for life-threatening allergic reaction.   gabapentin 300 MG capsule Commonly known as: NEURONTIN Take 300 mg by mouth 3 (three) times daily. Patient only take it once at night   hydrOXYzine 25 MG tablet Commonly known as: ATARAX Take 25 mg by mouth daily as needed.   ketoconazole 2 % shampoo Commonly known as: NIZORAL Apply 1 application topically 2 (two) times a week.   loratadine 10 MG tablet Commonly known as: CLARITIN Take 10 mg by mouth daily as needed for allergies.   MAGNESIUM PO Take by mouth daily.   Pataday 0.7 % Soln Generic drug: Olopatadine HCl Can use one drop in each eye once daily if needed for red, itchy, watery eyes.   rOPINIRole 0.25 MG tablet Commonly known as: REQUIP Take 0.5 mg by mouth at bedtime.   SUMAtriptan 100 MG tablet Commonly known as: IMITREX Take 100 mg by mouth daily as needed.   triamcinolone 55 MCG/ACT Aero nasal inhaler Commonly known as: Nasacort Allergy 24HR Can use one to two sprays in each nostril three to seven times per week.   VITAMIN C PO Take by mouth.   VITAMIN D3 PO Take by mouth daily.    Past Medical History:  Diagnosis Date   Anxiety    Asthma  Depression    Diabetes ([place])    Fibromyalgia    GERD (gastroesophageal reflux disease)    Headache    migraines   Shortness of breath dyspnea    due to asthma    Past Surgical History:  Procedure Laterality Date   BILATERAL SALPINGECTOMY Bilateral 07/28/2015   Procedure: BILATERAL SALPINGO-OOPHORECTOMY;  Surgeon: Cheri Fowler, MD;  Location: [facility];  Service: Gynecology;  Laterality: Bilateral;   CHOLECYSTECTOMY     CYSTOSCOPY N/A 07/28/2015   Procedure: CYSTOSCOPY;  Surgeon: Cheri Fowler, MD;  Location: [facility];  Service: Gynecology;  Laterality: N/A;   TONSILLECTOMY     VAGINAL  HYSTERECTOMY N/A 07/28/2015   Procedure: HYSTERECTOMY VAGINAL;  Surgeon: Cheri Fowler, MD;  Location: [facility];  Service: Gynecology;  Laterality: N/A;  MD needs 1.5hrs OR time    Review of systems negative except as noted in HPI / PMHx or noted below:  Review of Systems  Constitutional: Negative.   HENT: Negative.    Eyes: Negative.   Respiratory: Negative.    Cardiovascular: Negative.   Gastrointestinal: Negative.   Genitourinary: Negative.   Musculoskeletal: Negative.   Skin: Negative.   Neurological: Negative.   Endo/Heme/Allergies: Negative.   Psychiatric/Behavioral: Negative.       Objective:   Vitals:   05/03/22 1022  BP: 114/72  Pulse: 80  Resp: 16  SpO2: 94%   Height: 5\' 6"  (167.6 cm)  Weight: 244 lb 12.8 oz (111 kg)   Physical Exam Constitutional:      Appearance: She is not diaphoretic.  HENT:     Head: Normocephalic.     Right Ear: Tympanic membrane, ear canal and external ear normal.     Left Ear: Tympanic membrane, ear canal and external ear normal.     Nose: Nose normal. No mucosal edema or rhinorrhea.     Mouth/Throat:     Pharynx: Uvula midline. No oropharyngeal exudate.  Eyes:     Conjunctiva/sclera: Conjunctivae normal.  Neck:     Thyroid: No thyromegaly.     Trachea: Trachea normal. No tracheal tenderness or tracheal deviation.  Cardiovascular:     Rate and Rhythm: Normal rate and regular rhythm.     Heart sounds: Normal heart sounds, S1 normal and S2 normal. No murmur heard. Pulmonary:     Effort: No respiratory distress.     Breath sounds: Normal breath sounds. No stridor. No wheezing or rales.  Lymphadenopathy:     Head:     Right side of head: No tonsillar adenopathy.     Left side of head: No tonsillar adenopathy.     Cervical: No cervical adenopathy.  Skin:    Findings: No erythema or rash.     Nails: There is no clubbing.  Neurological:     Mental Status: She is alert.     Diagnostics:    Spirometry was performed and  demonstrated an FEV1 of 2.39 at 84 % of predicted.  Assessment and Plan:   1. Asthma, severe persistent, well-controlled   2. Perennial allergic rhinitis   3. Seasonal allergic rhinitis due to pollen   4. Adverse food reaction, subsequent encounter    1.  Allergen avoidance measures - dust mite, cat, dog, pollens, shellfish  2.  Treat and prevent inflammation:  A. Breztri - 2 inhalations 1-2 times per day w/ spacer (empty lungs) B. OTC Nasacort - 1-2 sprays each nostril 3-7 times per week  3.  If needed:  A. Epi-pen, benadryl, MD/ER evaluation for allergic  reaction B. AIRSUPRA - 2 inhalations or nebulizer every 4-6 hours C. Loratadine 10 mg or cetirizine 10 mg - 1 tablet 1 time per day D. Pataday - 1 drop each eye 1 time per day  4. Return to clinic in 6 months or earlier if problem  Meredith Olson appears to be doing quite well with her current anti-inflammatory therapy for her airway.  She has a very good understanding of her disease state and how her medications work and appropriate dosing of her medications depending on disease activity.  I have given her a combination albuterol/budesonide rescue inhaler to use to replace her albuterol inhaler.  I will see her back in this clinic in 6 months or earlier if there is a problem.  Meredith Katz, MD Allergy / Immunology [facility]

## 2022-05-04 ENCOUNTER — Encounter: Payer: Self-pay | Admitting: Allergy and Immunology

## 2022-05-09 NOTE — Therapy (Signed)
OUTPATIENT PHYSICAL THERAPY LOWER EXTREMITY EVALUATION   Patient Name: Meredith Olson MRN: [id number] DOB:October 06, 1970, 52 y.o., female Today's Date: 05/10/2022  END OF SESSION:  PT End of Session - 05/10/22 1028     Visit Number 1    Number of Visits 9    Date for PT Re-Evaluation 07/08/22    Authorization Type [employer]    PT Start Time 1016    PT Stop Time 1100    PT Time Calculation (min) 44 min    Activity Tolerance Patient tolerated treatment well    Behavior During Therapy WFL for tasks assessed/performed             Past Medical History:  Diagnosis Date   Anxiety    Asthma    Depression    Diabetes (HCC)    Fibromyalgia    GERD (gastroesophageal reflux disease)    Headache    migraines   Shortness of breath dyspnea    due to asthma   Past Surgical History:  Procedure Laterality Date   BILATERAL SALPINGECTOMY Bilateral 07/28/2015   Procedure: BILATERAL SALPINGO-OOPHORECTOMY;  Surgeon: Lavina Hamman, MD;  Location: [facility];  Service: Gynecology;  Laterality: Bilateral;   CHOLECYSTECTOMY     CYSTOSCOPY N/A 07/28/2015   Procedure: CYSTOSCOPY;  Surgeon: Lavina Hamman, MD;  Location: [facility];  Service: Gynecology;  Laterality: N/A;   TONSILLECTOMY     VAGINAL HYSTERECTOMY N/A 07/28/2015   Procedure: HYSTERECTOMY VAGINAL;  Surgeon: Lavina Hamman, MD;  Location: [facility];  Service: Gynecology;  Laterality: N/A;  MD needs 1.5hrs OR time   Patient Active Problem List   Diagnosis Date Noted   Acute respiratory failure with hypoxia (HCC) 01/15/2019   Transaminitis 01/15/2019   Viral pneumonia 01/09/2019   Obesity (BMI 35.0-39.9 without comorbidity) 01/09/2019   Respiratory failure with hypoxia (HCC) 01/09/2019   Pneumonia due to COVID-19 virus 01/08/2019   S/P vaginal hysterectomy 07/28/2015    PCP: Joice Lofts, NP  REFERRING PROVIDER: Tarry Kos, MD  REFERRING DIAG: [phone] (ICD-10-CM) - Bilateral hip pain   THERAPY DIAG:  Other low back  pain  Pain in right hip  Pain in left hip  Muscle weakness (generalized)  Difficulty in walking, not elsewhere classified  Rationale for Evaluation and Treatment: Rehabilitation  ONSET DATE: chronic   SUBJECTIVE:   SUBJECTIVE STATEMENT: Patient reports history of chronic low back and bilateral hip pain (Rt>Lt) that has progressively worsened. 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 She reports recent worsening of pain down the RLE that courses from the posterior thigh to the dorsum of the foot as well as in the groin. She reports localized pain to the Lt hip that feels deep, but is not as bothersome as the Rt. She reports occasional popping/clicking in the hips that can be painful. She denies any feelings of giving away. She reports occasional numbness and tingling about the posterior RLE with prolonged sitting, standing, walking. She denies any changes in bowel/bladder or saddle parasthesia. She has received a recent injection in the Rt hip with no relief and history of back injections with no relief. She has MRI scheduled for next week.   PERTINENT HISTORY: Anxiety Asthma Depression Diabetes Fibromyalgia  Recent hip injection   PAIN:  Are you having pain? Yes: NPRS scale: 6/10 Pain location: Rt posterior hip/groin and posterior thigh Pain description: tingling Aggravating factors: prolonged sitting, standing,walking (>10 minutes) Relieving factors: laying on Lt side  PRECAUTIONS: None  WEIGHT BEARING RESTRICTIONS: No  FALLS:  Has patient fallen in last 6 months? No  LIVING ENVIRONMENT: Lives with: lives with their spouse Lives in: House/apartment Stairs: Yes: External: ranges 3-8 steps; on right going up Has following equipment at home: None  OCCUPATION: disability   PLOF: Independent  PATIENT GOALS: "be able to move and do my normal activities without excruciating pain."     OBJECTIVE:   DIAGNOSTIC FINDINGS: Hip X-ray: no  acute or structural abnormalities   Lumbar X-ray: thoracolumbar scoliosis with multilevel DDD  PATIENT SURVEYS:  FOTO 31% function to 48% predicted   COGNITION: Overall cognitive status: Within functional limits for tasks assessed     SENSATION: WFL   MUSCLE LENGTH: Hamstrings: moderate tightness bilaterally   POSTURE: decreased lumbar lordosis  PALPATION: TTP bilateral lumbar paraspinals, bilateral gluteals, piriformis (Rt>Lt)  LUMBAR ROM:   Active  A/PROM  eval  Flexion 25% limited pn in low back and RLE  Extension 50% limited pn in low back  Right lateral flexion WNL pn in low back and RLE (worse then Lt lateral flexion)  Left lateral flexion WNL pn in low back and RLE  Right rotation WNL  Left rotation WNL   (Blank rows = not tested)  LOWER EXTREMITY ROM: Bilateral hip flexion PROM WNL with pain at end range Mild limitation into passive hip ER/IR bilaterally with pain   LOWER EXTREMITY MMT:  MMT Right eval Left eval  Hip flexion 4/5 pn 5  Hip extension 4-/5 pn 4  Hip abduction 4/5 pn  4+  Hip adduction    Hip internal rotation    Hip external rotation    Knee flexion 5 5  Knee extension 5 5  Ankle dorsiflexion 5 5  Ankle plantarflexion 5 5  Ankle inversion    Ankle eversion 5 5   (Blank rows = not tested)  LOWER EXTREMITY SPECIAL TESTS:  Slump (+) SLR (+) FABER (+ RLE) FADIR (+ bilateral)  FUNCTIONAL TESTS:  Functional lifting: limited depth, excessive trunk flexion, trunk rotation, limited knee flexion; increased pain.   GAIT: Distance walked: 10 ft  Assistive device utilized: None Level of assistance: Complete Independence Comments: excessive frontal plane movement    [facility] Adult PT Treatment:                                                DATE: 05/09/22 Therapeutic Exercise: Demonstrated and issue initial HEP.    Therapeutic Activity: Education on assessment findings that will be addressed throughout duration of POC.        PATIENT EDUCATION:  Education details: see treatment  Person educated: Patient Education method: Explanation, Demonstration, Tactile cues, Verbal cues, and Handouts Education comprehension: verbalized understanding, returned demonstration, verbal cues required, tactile cues required, and needs further education  HOME EXERCISE PROGRAM: Access Code: [email or web address] URL: [email or web address] Date: 05/10/2022 Prepared by: Gwendolyn Grant  Exercises - Supine Lower Trunk Rotation  - 1 x daily - 7 x weekly - 2 sets - 10 reps - Hooklying Clamshell with Resistance  - 1 x daily - 7 x weekly - 2 sets - 10 reps - Supine 90/90 Sciatic Nerve Glide with Knee Flexion/Extension  - 1 x daily - 7 x weekly - 1 sets - 10 reps - Supine Posterior Pelvic Tilt  - 1 x daily - 7 x weekly - 2 sets - 10  reps - 5 sec  hold - Modified Thomas Stretch  - 1 x daily - 7 x weekly - 3 sets - 30 sec  hold  ASSESSMENT:  CLINICAL IMPRESSION: Patient is a 52 y.o. female who was seen today for physical therapy evaluation and treatment for chronic low back and bilateral hip pain. Upon assessment she is noted to have limited and painful trunk AROM with patient reporting worst pain provocation with flexion and right lateral flexion. She is noted to have positive lumbar radiculopathy special testing of the RLE, but no myotomal weakness noted. She has core and hip weakness, postural abnormalities, aberrant lifting mechanics, and limited tolerance to sitting/standing/walking activity due to her pain. She will benefit from skilled PT to address the above stated deficits in order to optimize her function.   OBJECTIVE IMPAIRMENTS: Abnormal gait, decreased activity tolerance, decreased endurance, decreased knowledge of condition, difficulty walking, decreased ROM, decreased strength, increased fascial restrictions, impaired flexibility, improper body mechanics, postural dysfunction, and pain.   ACTIVITY LIMITATIONS:  carrying, lifting, bending, sitting, standing, squatting, and locomotion level  PARTICIPATION LIMITATIONS: meal prep, cleaning, laundry, shopping, and community activity  PERSONAL FACTORS: Age, Fitness, Time since onset of injury/illness/exacerbation, and 3+ comorbidities: see PMH above  are also affecting patient's functional outcome.   REHAB POTENTIAL: Fair chronicity of injury; personal factors above  CLINICAL DECISION MAKING: Evolving/moderate complexity  EVALUATION COMPLEXITY: Moderate   GOALS: Goals reviewed with patient? Yes  SHORT TERM GOALS: Target date: 06/07/2022   Patient will be independent and compliant with initial HEP.   Baseline: issued at eval.  Goal status: INITIAL  2.  Patient will demonstrate proper lifting mechanics to reduce stress on her back with household activities.  Baseline: see above  Goal status: INITIAL  3.  Patient will report no symptoms distal to the knee indicative of improvements in her current condition.  Baseline: see subjective  Goal status: INITIAL   LONG TERM GOALS: Target date: 07/05/2022   Patient will score at least 48% on FOTO to signify clinically meaningful improvement in functional abilities.   Baseline: see above Goal status: INITIAL  2.  Patient will be able to tolerate at least 30 minutes of standing activity to improve tolerance to cooking/cleaning.  Baseline: 10 minutes  Goal status: INITIAL  3.  Patient will demonstrate at least 4+/5 Rt hip strength to improve stability about the chain with prolonged walking activity.  Baseline: see above  Goal status: INITIAL  4.  Patient will report pain as </= 3/10 to reduce her current functional limitations.  Baseline: see above  Goal status: INITIAL    PLAN:  PT FREQUENCY: 1x/week  PT DURATION: 8 weeks  PLANNED INTERVENTIONS: Therapeutic exercises, Therapeutic activity, Neuromuscular re-education, Balance training, Gait training, Patient/Family education, Self Care,  Joint mobilization, Aquatic Therapy, Dry Needling, Electrical stimulation, Cryotherapy, Moist heat, Manual therapy, and Re-evaluation  PLAN FOR NEXT SESSION: review and progress HEP; consider TPDN to Rt glutes, trial repeated movement/LAD?, glute/core strengthening, Rt hip mobilizations.    Gwendolyn Grant, PT, DPT, ATC 05/10/22 1:29 PM  Referring diagnosis? M25.551,M25.552 (ICD-10-CM) - Bilateral hip pain  Treatment diagnosis? (if different than referring diagnosis)   Other low back pain  Pain in right hip  Pain in left hip  Muscle weakness (generalized)  Difficulty in walking, not elsewhere classified What was this (referring dx) caused by? []  Surgery []  Fall [x]  Ongoing issue []  Arthritis []  Other: ____________  Laterality: []  Rt []  Lt [x]  Both  Check all possible CPT  codes:  *CHOOSE 10 OR LESS*    []  97110 (Therapeutic Exercise)  []  92507 (SLP Treatment)  []  97112 (Neuro Re-ed)   []  92526 (Swallowing Treatment)   []  97116 (Gait Training)   []  [phone] (Cognitive Training, 1st 15 minutes) []  97140 (Manual Therapy)   []  97130 (Cognitive Training, each add'l 15 minutes)  []  97164 (Re-evaluation)                              []  Other, List CPT Code ____________  []  97530 (Therapeutic Activities)     []  68115 (Self Care)   [x]  All codes above (97110 - 97535)  []  97012 (Mechanical Traction)  [x]  97014 (E-stim Unattended)  []  97032 (E-stim manual)  []  97033 (Ionto)  []  97035 (Ultrasound) []  97750 (Physical Performance Training) [x]  [id number] (Aquatic Therapy) []  97016 (Vasopneumatic Device) []  [id number] (Paraffin) []  97034 (Contrast Bath) []  97597 (Wound Care 1st 20 sq cm) []  97598 (Wound Care each add'l 20 sq cm) []  97760 (Orthotic Fabrication, Fitting, Training Initial) []  [id number] (Prosthetic Management and Training Initial) []  [id number] (Orthotic or Prosthetic Training/ Modification Subsequent)

## 2022-05-10 ENCOUNTER — Other Ambulatory Visit: Payer: Self-pay

## 2022-05-10 ENCOUNTER — Ambulatory Visit: Payer: Medicare HMO | Attending: Orthopaedic Surgery

## 2022-05-10 DIAGNOSIS — M25552 Pain in left hip: Secondary | ICD-10-CM | POA: Insufficient documentation

## 2022-05-10 DIAGNOSIS — M25551 Pain in right hip: Secondary | ICD-10-CM | POA: Diagnosis not present

## 2022-05-10 DIAGNOSIS — R262 Difficulty in walking, not elsewhere classified: Secondary | ICD-10-CM | POA: Diagnosis not present

## 2022-05-10 DIAGNOSIS — M5459 Other low back pain: Secondary | ICD-10-CM | POA: Insufficient documentation

## 2022-05-10 DIAGNOSIS — M6281 Muscle weakness (generalized): Secondary | ICD-10-CM | POA: Insufficient documentation

## 2022-05-15 ENCOUNTER — Ambulatory Visit
Admission: RE | Admit: 2022-05-15 | Discharge: 2022-05-15 | Disposition: A | Discharge status: Home / Self Care | Payer: Medicare HMO | Source: Ambulatory Visit | Attending: Orthopaedic Surgery | Admitting: Orthopaedic Surgery

## 2022-05-15 DIAGNOSIS — M48061 Spinal stenosis, lumbar region without neurogenic claudication: Secondary | ICD-10-CM | POA: Diagnosis not present

## 2022-05-15 DIAGNOSIS — M47816 Spondylosis without myelopathy or radiculopathy, lumbar region: Secondary | ICD-10-CM | POA: Diagnosis not present

## 2022-05-15 DIAGNOSIS — M545 Low back pain, unspecified: Secondary | ICD-10-CM | POA: Diagnosis not present

## 2022-05-15 DIAGNOSIS — M25551 Pain in right hip: Secondary | ICD-10-CM

## 2022-05-16 DIAGNOSIS — Z794 Long term (current) use of insulin: Secondary | ICD-10-CM | POA: Diagnosis not present

## 2022-05-16 DIAGNOSIS — E1165 Type 2 diabetes mellitus with hyperglycemia: Secondary | ICD-10-CM | POA: Diagnosis not present

## 2022-05-16 NOTE — Therapy (Signed)
OUTPATIENT PHYSICAL THERAPY TREATMENT NOTE   Patient Name: Meredith Olson MRN: [id number] DOB:09-Jun-1970, 52 y.o., female Today's Date: 05/17/2022  PCP: Earna Coder, NP REFERRING PROVIDER: Leandrew Koyanagi, MD  END OF SESSION:   PT End of Session - 05/17/22 0946     Visit Number 2    Number of Visits 9    Date for PT Re-Evaluation 07/08/22    Authorization Type [employer]    PT Start Time 0950    PT Stop Time 1037    PT Time Calculation (min) 47 min    Activity Tolerance Patient tolerated treatment well    Behavior During Therapy WFL for tasks assessed/performed             Past Medical History:  Diagnosis Date   Anxiety    Asthma    Depression    Diabetes ([place])    Fibromyalgia    GERD (gastroesophageal reflux disease)    Headache    migraines   Shortness of breath dyspnea    due to asthma   Past Surgical History:  Procedure Laterality Date   BILATERAL SALPINGECTOMY Bilateral 07/28/2015   Procedure: BILATERAL SALPINGO-OOPHORECTOMY;  Surgeon: Cheri Fowler, MD;  Location: [facility];  Service: Gynecology;  Laterality: Bilateral;   CHOLECYSTECTOMY     CYSTOSCOPY N/A 07/28/2015   Procedure: CYSTOSCOPY;  Surgeon: Cheri Fowler, MD;  Location: [facility];  Service: Gynecology;  Laterality: N/A;   TONSILLECTOMY     VAGINAL HYSTERECTOMY N/A 07/28/2015   Procedure: HYSTERECTOMY VAGINAL;  Surgeon: Cheri Fowler, MD;  Location: [facility];  Service: Gynecology;  Laterality: N/A;  MD needs 1.5hrs OR time   Patient Active Problem List   Diagnosis Date Noted   Acute respiratory failure with hypoxia ([place]) 01/15/2019   Transaminitis 01/15/2019   Viral pneumonia 01/09/2019   Obesity (BMI 35.0-39.9 without comorbidity) 01/09/2019   Respiratory failure with hypoxia ([place]) 01/09/2019   Pneumonia due to COVID-19 virus 01/08/2019   S/P vaginal hysterectomy 07/28/2015    REFERRING DIAG: bilateral hip pain   THERAPY DIAG:  Other low back pain  Pain in right hip  Pain in left  hip  Muscle weakness (generalized)  Difficulty in walking, not elsewhere classified  Rationale for Evaluation and Treatment Rehabilitation  PERTINENT HISTORY:  Anxiety Asthma Depression Diabetes Fibromyalgia  Recent hip injection   PRECAUTIONS: none  SUBJECTIVE:                                                                                                                                                                                      SUBJECTIVE STATEMENT:  "I've had quite a bit of pain this past week. I'm  hoping to get my MRI results back."    PAIN:   Are you having pain? Yes: NPRS scale: 5/10 Pain location: Rt  hip and low back Pain description: sore Aggravating factors: prolonged sitting, standing,walking (>10 minutes) Relieving factors: laying on Lt side   OBJECTIVE: (objective measures completed at initial evaluation unless otherwise dated)  DIAGNOSTIC FINDINGS: Hip X-ray: no acute or structural abnormalities    Lumbar X-ray: thoracolumbar scoliosis with multilevel DDD  Lumbar MRI: IMPRESSION: 1. Unchanged multilevel lumbar spondylosis as described above. Unchanged right foraminal disc protrusions at L3-L4 and L4-L5 potentially affecting the exiting right L3 and L4 nerve roots, respectively.   PATIENT SURVEYS:  FOTO 31% function to 48% predicted    COGNITION: Overall cognitive status: Within functional limits for tasks assessed                         SENSATION: WFL     MUSCLE LENGTH: Hamstrings: moderate tightness bilaterally    POSTURE: decreased lumbar lordosis   PALPATION: TTP bilateral lumbar paraspinals, bilateral gluteals, piriformis (Rt>Lt)  LUMBAR ROM:    Active  A/PROM  eval  Flexion 25% limited pn in low back and RLE  Extension 50% limited pn in low back  Right lateral flexion WNL pn in low back and RLE (worse then Lt lateral flexion)  Left lateral flexion WNL pn in low back and RLE  Right rotation WNL  Left rotation WNL    (Blank rows = not tested)   LOWER EXTREMITY ROM: Bilateral hip flexion PROM WNL with pain at end range Mild limitation into passive hip ER/IR bilaterally with pain    LOWER EXTREMITY MMT:   MMT Right eval Left eval  Hip flexion 4/5 pn 5  Hip extension 4-/5 pn 4  Hip abduction 4/5 pn  4+  Hip adduction      Hip internal rotation      Hip external rotation      Knee flexion 5 5  Knee extension 5 5  Ankle dorsiflexion 5 5  Ankle plantarflexion 5 5  Ankle inversion      Ankle eversion 5 5   (Blank rows = not tested)   LOWER EXTREMITY SPECIAL TESTS:  Slump (+) SLR (+) FABER (+ RLE) FADIR (+ bilateral)   FUNCTIONAL TESTS:  Functional lifting: limited depth, excessive trunk flexion, trunk rotation, limited knee flexion; increased pain.    GAIT: Distance walked: 10 ft  Assistive device utilized: None Level of assistance: Complete Independence Comments: excessive frontal plane movement   [facility] Adult PT Treatment:                                                DATE: 05/17/22 Therapeutic Exercise: NuStep level 5 x 5 minutes UE/LE  LTR x 1 minute  Supine posterior pelvic tilts x 10; 5 sec hold  Hip flexor stretch x 30 sec Bent knee fallout 2 x 10 Standing lumbar extension x 10  Clamshells  2 x 10  Updated HEP  Manual: LAD x 1 minute each  Self Care: Education on posture, lifting mechanics with handout provided.     [facility] Adult PT Treatment:  DATE: 05/09/22 Therapeutic Exercise: Demonstrated and issue initial HEP.      Therapeutic Activity: Education on assessment findings that will be addressed throughout duration of POC.            PATIENT EDUCATION:  Education details: see treatment  Person educated: Patient Education method: Explanation, Demonstration, Tactile cues, Verbal cues, and Handouts Education comprehension: verbalized understanding, returned demonstration, verbal cues required, tactile cues required, and  needs further education   HOME EXERCISE PROGRAM: Access Code: [email or web address] URL: [email or web address] Date: 05/10/2022 Prepared by: Gwendolyn Grant   Exercises - Supine Lower Trunk Rotation  - 1 x daily - 7 x weekly - 2 sets - 10 reps - Hooklying Clamshell with Resistance  - 1 x daily - 7 x weekly - 2 sets - 10 reps - Supine 90/90 Sciatic Nerve Glide with Knee Flexion/Extension  - 1 x daily - 7 x weekly - 1 sets - 10 reps - Supine Posterior Pelvic Tilt  - 1 x daily - 7 x weekly - 2 sets - 10 reps - 5 sec  hold - Modified Thomas Stretch  - 1 x daily - 7 x weekly - 3 sets - 30 sec  hold   ASSESSMENT:   CLINICAL IMPRESSION: Patient arrives with moderate low back and hip pain. She appears to have a positive response to extension biased movement reporting centralization of her pain following standing lumbar extension. Extension biased movement was added to her HEP. With LAD she reports short term relief of back pain. Able to progress core and hip strengthening with good tolerance. Time spent discussing proper lifting mechanics and posture to reduce stress on her back with handout provided.    OBJECTIVE IMPAIRMENTS: Abnormal gait, decreased activity tolerance, decreased endurance, decreased knowledge of condition, difficulty walking, decreased ROM, decreased strength, increased fascial restrictions, impaired flexibility, improper body mechanics, postural dysfunction, and pain.    ACTIVITY LIMITATIONS: carrying, lifting, bending, sitting, standing, squatting, and locomotion level   PARTICIPATION LIMITATIONS: meal prep, cleaning, laundry, shopping, and community activity   PERSONAL FACTORS: Age, Fitness, Time since onset of injury/illness/exacerbation, and 3+ comorbidities: see PMH above  are also affecting patient's functional outcome.    REHAB POTENTIAL: Fair chronicity of injury; personal factors above   CLINICAL DECISION MAKING: Evolving/moderate complexity   EVALUATION  COMPLEXITY: Moderate     GOALS: Goals reviewed with patient? Yes   SHORT TERM GOALS: Target date: 06/07/2022     Patient will be independent and compliant with initial HEP.    Baseline: issued at eval.  Goal status: INITIAL   2.  Patient will demonstrate proper lifting mechanics to reduce stress on her back with household activities.  Baseline: see above  Goal status: INITIAL   3.  Patient will report no symptoms distal to the knee indicative of improvements in her current condition.  Baseline: see subjective  Goal status: INITIAL     LONG TERM GOALS: Target date: 07/05/2022     Patient will score at least 48% on FOTO to signify clinically meaningful improvement in functional abilities.    Baseline: see above Goal status: INITIAL   2.  Patient will be able to tolerate at least 30 minutes of standing activity to improve tolerance to cooking/cleaning.  Baseline: 10 minutes  Goal status: INITIAL   3.  Patient will demonstrate at least 4+/5 Rt hip strength to improve stability about the chain with prolonged walking activity.  Baseline: see above  Goal status: INITIAL   4.  Patient will report  pain as </= 3/10 to reduce her current functional limitations.  Baseline: see above  Goal status: INITIAL       PLAN:   PT FREQUENCY: 1x/week   PT DURATION: 8 weeks   PLANNED INTERVENTIONS: Therapeutic exercises, Therapeutic activity, Neuromuscular re-education, Balance training, Gait training, Patient/Family education, Self Care, Joint mobilization, Aquatic Therapy, Dry Needling, Electrical stimulation, Cryotherapy, Moist heat, Manual therapy, and Re-evaluation   PLAN FOR NEXT SESSION: review and progress HEP; consider TPDN to Rt glutes, response to repeated movement, glute/core strengthening, Rt hip mobilizations.       Gwendolyn Grant, PT, DPT, ATC 05/17/22 10:41 AM

## 2022-05-17 ENCOUNTER — Ambulatory Visit: Payer: Medicare HMO

## 2022-05-17 DIAGNOSIS — M6281 Muscle weakness (generalized): Secondary | ICD-10-CM

## 2022-05-17 DIAGNOSIS — M25552 Pain in left hip: Secondary | ICD-10-CM

## 2022-05-17 DIAGNOSIS — R262 Difficulty in walking, not elsewhere classified: Secondary | ICD-10-CM

## 2022-05-17 DIAGNOSIS — M25551 Pain in right hip: Secondary | ICD-10-CM

## 2022-05-17 DIAGNOSIS — M5459 Other low back pain: Secondary | ICD-10-CM | POA: Diagnosis not present

## 2022-05-17 NOTE — Patient Instructions (Signed)

## 2022-05-24 ENCOUNTER — Ambulatory Visit: Payer: Medicare HMO

## 2022-05-24 DIAGNOSIS — M25551 Pain in right hip: Secondary | ICD-10-CM | POA: Diagnosis not present

## 2022-05-24 DIAGNOSIS — M5459 Other low back pain: Secondary | ICD-10-CM

## 2022-05-24 DIAGNOSIS — M25552 Pain in left hip: Secondary | ICD-10-CM | POA: Diagnosis not present

## 2022-05-24 DIAGNOSIS — M6281 Muscle weakness (generalized): Secondary | ICD-10-CM

## 2022-05-24 DIAGNOSIS — R262 Difficulty in walking, not elsewhere classified: Secondary | ICD-10-CM | POA: Diagnosis not present

## 2022-05-24 NOTE — Therapy (Addendum)
OUTPATIENT PHYSICAL THERAPY TREATMENT NOTE  PHYSICAL THERAPY DISCHARGE SUMMARY  Visits from Start of Care: 3  Current functional level related to goals / functional outcomes: No re-assessment of goals   Remaining deficits: Status unknown   Education / Equipment: N/A   Patient agrees to discharge. Patient goals were not met. Patient is being discharged due to not returning since the last visit.  Patient Name: Meredith Olson MRN: [id number] DOB:06/14/1970, 52 y.o., female Today's Date: 05/24/2022  PCP: Joice Lofts, NP REFERRING PROVIDER: Tarry Kos, MD  END OF SESSION:   PT End of Session - 05/24/22 1015     Visit Number 3    Number of Visits 9    Date for PT Re-Evaluation 07/08/22    Authorization Type [employer]    Authorization Time Period 2/14-4/13/24    Authorization - Visit Number 2    Authorization - Number of Visits 8    PT Start Time 1015    PT Stop Time 1059    PT Time Calculation (min) 44 min    Activity Tolerance Patient tolerated treatment well    Behavior During Therapy WFL for tasks assessed/performed              Past Medical History:  Diagnosis Date   Anxiety    Asthma    Depression    Diabetes (HCC)    Fibromyalgia    GERD (gastroesophageal reflux disease)    Headache    migraines   Shortness of breath dyspnea    due to asthma   Past Surgical History:  Procedure Laterality Date   BILATERAL SALPINGECTOMY Bilateral 07/28/2015   Procedure: BILATERAL SALPINGO-OOPHORECTOMY;  Surgeon: Lavina Hamman, MD;  Location: [facility];  Service: Gynecology;  Laterality: Bilateral;   CHOLECYSTECTOMY     CYSTOSCOPY N/A 07/28/2015   Procedure: CYSTOSCOPY;  Surgeon: Lavina Hamman, MD;  Location: [facility];  Service: Gynecology;  Laterality: N/A;   TONSILLECTOMY     VAGINAL HYSTERECTOMY N/A 07/28/2015   Procedure: HYSTERECTOMY VAGINAL;  Surgeon: Lavina Hamman, MD;  Location: [facility];  Service: Gynecology;  Laterality: N/A;  MD needs 1.5hrs OR time    Patient Active Problem List   Diagnosis Date Noted   Acute respiratory failure with hypoxia (HCC) 01/15/2019   Transaminitis 01/15/2019   Viral pneumonia 01/09/2019   Obesity (BMI 35.0-39.9 without comorbidity) 01/09/2019   Respiratory failure with hypoxia (HCC) 01/09/2019   Pneumonia due to COVID-19 virus 01/08/2019   S/P vaginal hysterectomy 07/28/2015    REFERRING DIAG: bilateral hip pain   THERAPY DIAG:  Other low back pain  Pain in right hip  Pain in left hip  Muscle weakness (generalized)  Difficulty in walking, not elsewhere classified  Rationale for Evaluation and Treatment Rehabilitation  PERTINENT HISTORY:  Anxiety Asthma Depression Diabetes Fibromyalgia  Recent hip injection   PRECAUTIONS: none  SUBJECTIVE:  SUBJECTIVE STATEMENT:  "A little better I think."    PAIN:   Are you having pain? Yes: NPRS scale: 4/10 Pain location: Rt hip and low back Pain description: dull, ache  Aggravating factors: prolonged sitting, standing,walking (>10 minutes) Relieving factors: laying on Lt side   OBJECTIVE: (objective measures completed at initial evaluation unless otherwise dated)  DIAGNOSTIC FINDINGS: Hip X-ray: no acute or structural abnormalities    Lumbar X-ray: thoracolumbar scoliosis with multilevel DDD  Lumbar MRI: IMPRESSION: 1. Unchanged multilevel lumbar spondylosis as described above. Unchanged right foraminal disc protrusions at L3-L4 and L4-L5 potentially affecting the exiting right L3 and L4 nerve roots, respectively.   PATIENT SURVEYS:  FOTO 31% function to 48% predicted    COGNITION: Overall cognitive status: Within functional limits for tasks assessed                         SENSATION: WFL     MUSCLE LENGTH: Hamstrings: moderate tightness bilaterally     POSTURE: decreased lumbar lordosis   PALPATION: TTP bilateral lumbar paraspinals, bilateral gluteals, piriformis (Rt>Lt)  LUMBAR ROM:    Active  A/PROM  eval  Flexion 25% limited pn in low back and RLE  Extension 50% limited pn in low back  Right lateral flexion WNL pn in low back and RLE (worse then Lt lateral flexion)  Left lateral flexion WNL pn in low back and RLE  Right rotation WNL  Left rotation WNL   (Blank rows = not tested)   LOWER EXTREMITY ROM: Bilateral hip flexion PROM WNL with pain at end range Mild limitation into passive hip ER/IR bilaterally with pain    LOWER EXTREMITY MMT:   MMT Right eval Left eval  Hip flexion 4/5 pn 5  Hip extension 4-/5 pn 4  Hip abduction 4/5 pn  4+  Hip adduction      Hip internal rotation      Hip external rotation      Knee flexion 5 5  Knee extension 5 5  Ankle dorsiflexion 5 5  Ankle plantarflexion 5 5  Ankle inversion      Ankle eversion 5 5   (Blank rows = not tested)   LOWER EXTREMITY SPECIAL TESTS:  Slump (+) SLR (+) FABER (+ RLE) FADIR (+ bilateral)   FUNCTIONAL TESTS:  Functional lifting: limited depth, excessive trunk flexion, trunk rotation, limited knee flexion; increased pain.    GAIT: Distance walked: 10 ft  Assistive device utilized: None Level of assistance: Complete Independence Comments: excessive frontal plane movement  [facility] Adult PT Treatment:                                                DATE: 05/24/22 Therapeutic Exercise: NuStep level 6 x 5 minutes UE/LE  LTR x 1 minute  Supine posterior pelvic tilts x 10; 5 sec hold  Supine TA march 2 x 10  Single knee to chest x 5; 5 sec hold Sidelying hip abduction 2 x 10  SLR with posterior pelvic tilt x 5 each  Partial range bridge x 5  Updated HEP Manual Therapy: STM Rt gluteals,piriformis, bilateral lumbar paraspinals Rt hip mobilizations inferior and posterior Rt LAD x 1 minute  Trigger Point Dry Needling Treatment: Pre-treatment  instruction: Patient instructed on dry needling rationale, procedures, and possible side effects including pain during treatment (  achy,cramping feeling), bruising, drop of blood, lightheadedness, nausea, sweating. Patient Consent Given: Yes Education handout provided: Yes Muscles treated: Rt glute med/min  Needle size and number: .30x119mm x 1 Electrical stimulation performed: No Parameters: N/A Treatment response/outcome: Twitch response elicited and Palpable decrease in muscle tension Post-treatment instructions: Patient instructed to expect possible mild to moderate muscle soreness later today and/or tomorrow. Patient instructed in methods to reduce muscle soreness and to continue prescribed HEP. If patient was dry needled over the lung field, patient was instructed on signs and symptoms of pneumothorax and, however unlikely, to see immediate medical attention should they occur. Patient was also educated on signs and symptoms of infection and to seek medical attention should they occur. Patient verbalized understanding of these instructions and education.   [facility] Adult PT Treatment:                                                DATE: 05/17/22 Therapeutic Exercise: NuStep level 5 x 5 minutes UE/LE  LTR x 1 minute  Supine posterior pelvic tilts x 10; 5 sec hold  Hip flexor stretch x 30 sec Bent knee fallout 2 x 10 Standing lumbar extension x 10  Clamshells  2 x 10  Updated HEP  Manual: LAD x 1 minute each  Self Care: Education on posture, lifting mechanics with handout provided.     [facility] Adult PT Treatment:                                                DATE: 05/09/22 Therapeutic Exercise: Demonstrated and issue initial HEP.      Therapeutic Activity: Education on assessment findings that will be addressed throughout duration of POC.            PATIENT EDUCATION:  Education details: see treatment  Person educated: Patient Education method: Explanation, Demonstration, Tactile  cues, Verbal cues, and Handouts Education comprehension: verbalized understanding, returned demonstration, verbal cues required, tactile cues required, and needs further education   HOME EXERCISE PROGRAM: Access Code: [email or web address] URL: [email or web address] Date: 05/10/2022 Prepared by: Letitia Libra   Exercises - Supine Lower Trunk Rotation  - 1 x daily - 7 x weekly - 2 sets - 10 reps - Hooklying Clamshell with Resistance  - 1 x daily - 7 x weekly - 2 sets - 10 reps - Supine 90/90 Sciatic Nerve Glide with Knee Flexion/Extension  - 1 x daily - 7 x weekly - 1 sets - 10 reps - Supine Posterior Pelvic Tilt  - 1 x daily - 7 x weekly - 2 sets - 10 reps - 5 sec  hold - Modified Thomas Stretch  - 1 x daily - 7 x weekly - 3 sets - 30 sec  hold   ASSESSMENT:   CLINICAL IMPRESSION: Patient arrives with mild low back and hip pain. TPDN was performed to Rt gluteals with patient reporting soreness post-intervention as expected. Focused on progression of core and hip strengthening with fairly good tolerance as patient continued to endorse low back and Rt hip pain. HEP was updated to include further strengthening.    OBJECTIVE IMPAIRMENTS: Abnormal gait, decreased activity tolerance, decreased endurance, decreased knowledge of condition, difficulty walking, decreased ROM, decreased strength, increased fascial restrictions,  impaired flexibility, improper body mechanics, postural dysfunction, and pain.    ACTIVITY LIMITATIONS: carrying, lifting, bending, sitting, standing, squatting, and locomotion level   PARTICIPATION LIMITATIONS: meal prep, cleaning, laundry, shopping, and community activity   PERSONAL FACTORS: Age, Fitness, Time since onset of injury/illness/exacerbation, and 3+ comorbidities: see PMH above  are also affecting patient's functional outcome.    REHAB POTENTIAL: Fair chronicity of injury; personal factors above   CLINICAL DECISION MAKING: Evolving/moderate complexity    EVALUATION COMPLEXITY: Moderate     GOALS: Goals reviewed with patient? Yes   SHORT TERM GOALS: Target date: 06/07/2022     Patient will be independent and compliant with initial HEP.    Baseline: issued at eval.  Goal status: INITIAL   2.  Patient will demonstrate proper lifting mechanics to reduce stress on her back with household activities.  Baseline: see above  Goal status: INITIAL   3.  Patient will report no symptoms distal to the knee indicative of improvements in her current condition.  Baseline: see subjective  Goal status: INITIAL     LONG TERM GOALS: Target date: 07/05/2022     Patient will score at least 48% on FOTO to signify clinically meaningful improvement in functional abilities.    Baseline: see above Goal status: INITIAL   2.  Patient will be able to tolerate at least 30 minutes of standing activity to improve tolerance to cooking/cleaning.  Baseline: 10 minutes  Goal status: INITIAL   3.  Patient will demonstrate at least 4+/5 Rt hip strength to improve stability about the chain with prolonged walking activity.  Baseline: see above  Goal status: INITIAL   4.  Patient will report pain as </= 3/10 to reduce her current functional limitations.  Baseline: see above  Goal status: INITIAL       PLAN:   PT FREQUENCY: n/a   PT DURATION: n/a   PLANNED INTERVENTIONS: Therapeutic exercises, Therapeutic activity, Neuromuscular re-education, Balance training, Gait training, Patient/Family education, Self Care, Joint mobilization, Aquatic Therapy, Dry Needling, Electrical stimulation, Cryotherapy, Moist heat, Manual therapy, and Re-evaluation   PLAN FOR NEXT SESSION: n/a      Letitia Libra, PT, DPT, ATC 05/24/22 11:00 AM  Letitia Libra, PT, DPT, ATC 08/08/22 9:26 AM

## 2022-05-24 NOTE — Patient Instructions (Signed)

## 2022-05-29 ENCOUNTER — Encounter: Payer: Medicare HMO | Admitting: Physical Therapy

## 2022-05-30 DIAGNOSIS — Z13820 Encounter for screening for osteoporosis: Secondary | ICD-10-CM | POA: Diagnosis not present

## 2022-05-30 DIAGNOSIS — Z79899 Other long term (current) drug therapy: Secondary | ICD-10-CM | POA: Diagnosis not present

## 2022-05-30 DIAGNOSIS — E119 Type 2 diabetes mellitus without complications: Secondary | ICD-10-CM | POA: Diagnosis not present

## 2022-05-30 DIAGNOSIS — F419 Anxiety disorder, unspecified: Secondary | ICD-10-CM | POA: Diagnosis not present

## 2022-05-30 DIAGNOSIS — E559 Vitamin D deficiency, unspecified: Secondary | ICD-10-CM | POA: Diagnosis not present

## 2022-05-30 DIAGNOSIS — M1990 Unspecified osteoarthritis, unspecified site: Secondary | ICD-10-CM | POA: Diagnosis not present

## 2022-05-30 DIAGNOSIS — Z6839 Body mass index (BMI) 39.0-39.9, adult: Secondary | ICD-10-CM | POA: Diagnosis not present

## 2022-05-31 DIAGNOSIS — E1165 Type 2 diabetes mellitus with hyperglycemia: Secondary | ICD-10-CM | POA: Diagnosis not present

## 2022-05-31 DIAGNOSIS — Z794 Long term (current) use of insulin: Secondary | ICD-10-CM | POA: Diagnosis not present

## 2022-06-05 DIAGNOSIS — Z1212 Encounter for screening for malignant neoplasm of rectum: Secondary | ICD-10-CM | POA: Diagnosis not present

## 2022-06-05 DIAGNOSIS — Z1211 Encounter for screening for malignant neoplasm of colon: Secondary | ICD-10-CM | POA: Diagnosis not present

## 2022-06-06 DIAGNOSIS — Z0100 Encounter for examination of eyes and vision without abnormal findings: Secondary | ICD-10-CM | POA: Diagnosis not present

## 2022-06-06 DIAGNOSIS — H52223 Regular astigmatism, bilateral: Secondary | ICD-10-CM | POA: Diagnosis not present

## 2022-06-06 DIAGNOSIS — H524 Presbyopia: Secondary | ICD-10-CM | POA: Diagnosis not present

## 2022-06-06 DIAGNOSIS — Z794 Long term (current) use of insulin: Secondary | ICD-10-CM | POA: Diagnosis not present

## 2022-06-06 DIAGNOSIS — E119 Type 2 diabetes mellitus without complications: Secondary | ICD-10-CM | POA: Diagnosis not present

## 2022-06-06 DIAGNOSIS — H5231 Anisometropia: Secondary | ICD-10-CM | POA: Diagnosis not present

## 2022-06-07 ENCOUNTER — Ambulatory Visit: Payer: Medicare HMO

## 2022-06-08 DIAGNOSIS — R300 Dysuria: Secondary | ICD-10-CM | POA: Diagnosis not present

## 2022-06-08 DIAGNOSIS — R509 Fever, unspecified: Secondary | ICD-10-CM | POA: Diagnosis not present

## 2022-06-08 DIAGNOSIS — J3489 Other specified disorders of nose and nasal sinuses: Secondary | ICD-10-CM | POA: Diagnosis not present

## 2022-06-12 ENCOUNTER — Telehealth: Payer: Self-pay

## 2022-06-12 DIAGNOSIS — H1045 Other chronic allergic conjunctivitis: Secondary | ICD-10-CM | POA: Diagnosis not present

## 2022-06-12 MED ORDER — AIRSUPRA 90-80 MCG/ACT IN AERO: 2.0000 | INHALATION_SPRAY | RESPIRATORY_TRACT | 1 refills | Status: DC | PRN

## 2022-06-12 NOTE — Telephone Encounter (Signed)
Patient approved. PA Case: [id number], Status: Approved, Coverage Starts on: 04/03/2022 12:00:00 AM, Coverage Ends on: 04/03/2023 12:00:00 AM.  Casilda Carls ERX to [facility] with approval information attached.

## 2022-06-12 NOTE — Telephone Encounter (Signed)
Submitted prior authorization request for AirSupra on [email or web address]. Awaiting decision.

## 2022-06-12 NOTE — Addendum Note (Signed)
Addended by: Zandra Abts on: 06/12/2022 12:31 PM   Modules accepted: Orders

## 2022-06-13 ENCOUNTER — Telehealth: Payer: Self-pay

## 2022-06-13 NOTE — Telephone Encounter (Signed)
Patient called to cancel appt for tomorrow due to her having an infection in her eye and wont be able to come, she also stated she is strugging financially and wanted to cancel all appts. I informed her that we keep referrals open for a month so if she were to need therapy after that point she would need a new referral, she expressed understanding.

## 2022-06-14 ENCOUNTER — Ambulatory Visit: Payer: Medicare HMO

## 2022-06-21 ENCOUNTER — Ambulatory Visit: Payer: Medicare HMO

## 2022-06-28 ENCOUNTER — Ambulatory Visit: Payer: Medicare HMO

## 2022-07-05 ENCOUNTER — Ambulatory Visit: Payer: Medicare HMO

## 2022-07-05 DIAGNOSIS — E1165 Type 2 diabetes mellitus with hyperglycemia: Secondary | ICD-10-CM | POA: Diagnosis not present

## 2022-07-05 DIAGNOSIS — Z794 Long term (current) use of insulin: Secondary | ICD-10-CM | POA: Diagnosis not present

## 2022-07-05 DIAGNOSIS — E8941 Symptomatic postprocedural ovarian failure: Secondary | ICD-10-CM | POA: Diagnosis not present

## 2022-07-13 DIAGNOSIS — E8941 Symptomatic postprocedural ovarian failure: Secondary | ICD-10-CM | POA: Diagnosis not present

## 2022-07-20 DIAGNOSIS — H66001 Acute suppurative otitis media without spontaneous rupture of ear drum, right ear: Secondary | ICD-10-CM | POA: Diagnosis not present

## 2022-07-20 DIAGNOSIS — J00 Acute nasopharyngitis [common cold]: Secondary | ICD-10-CM | POA: Diagnosis not present

## 2022-07-21 DIAGNOSIS — N959 Unspecified menopausal and perimenopausal disorder: Secondary | ICD-10-CM | POA: Diagnosis not present

## 2022-07-21 DIAGNOSIS — Z13820 Encounter for screening for osteoporosis: Secondary | ICD-10-CM | POA: Diagnosis not present

## 2022-07-21 DIAGNOSIS — Z1231 Encounter for screening mammogram for malignant neoplasm of breast: Secondary | ICD-10-CM | POA: Diagnosis not present

## 2022-07-27 DIAGNOSIS — M79674 Pain in right toe(s): Secondary | ICD-10-CM | POA: Diagnosis not present

## 2022-07-27 DIAGNOSIS — Z6841 Body Mass Index (BMI) 40.0 and over, adult: Secondary | ICD-10-CM | POA: Diagnosis not present

## 2022-07-27 DIAGNOSIS — B351 Tinea unguium: Secondary | ICD-10-CM | POA: Diagnosis not present

## 2022-07-27 DIAGNOSIS — E119 Type 2 diabetes mellitus without complications: Secondary | ICD-10-CM | POA: Diagnosis not present

## 2022-07-27 DIAGNOSIS — M79675 Pain in left toe(s): Secondary | ICD-10-CM | POA: Diagnosis not present

## 2022-08-07 DIAGNOSIS — B351 Tinea unguium: Secondary | ICD-10-CM | POA: Diagnosis not present

## 2022-08-07 DIAGNOSIS — M79675 Pain in left toe(s): Secondary | ICD-10-CM | POA: Diagnosis not present

## 2022-08-07 DIAGNOSIS — B359 Dermatophytosis, unspecified: Secondary | ICD-10-CM | POA: Diagnosis not present

## 2022-08-09 DIAGNOSIS — N6489 Other specified disorders of breast: Secondary | ICD-10-CM | POA: Diagnosis not present

## 2022-08-09 DIAGNOSIS — R928 Other abnormal and inconclusive findings on diagnostic imaging of breast: Secondary | ICD-10-CM | POA: Diagnosis not present

## 2022-08-09 DIAGNOSIS — R92331 Mammographic heterogeneous density, right breast: Secondary | ICD-10-CM | POA: Diagnosis not present

## 2022-08-14 DIAGNOSIS — R195 Other fecal abnormalities: Secondary | ICD-10-CM | POA: Diagnosis not present

## 2022-08-14 DIAGNOSIS — K649 Unspecified hemorrhoids: Secondary | ICD-10-CM | POA: Diagnosis not present

## 2022-08-16 DIAGNOSIS — H9203 Otalgia, bilateral: Secondary | ICD-10-CM | POA: Diagnosis not present

## 2022-08-16 DIAGNOSIS — H6993 Unspecified Eustachian tube disorder, bilateral: Secondary | ICD-10-CM | POA: Diagnosis not present

## 2022-08-17 DIAGNOSIS — D225 Melanocytic nevi of trunk: Secondary | ICD-10-CM | POA: Diagnosis not present

## 2022-08-17 DIAGNOSIS — L821 Other seborrheic keratosis: Secondary | ICD-10-CM | POA: Diagnosis not present

## 2022-08-17 DIAGNOSIS — I781 Nevus, non-neoplastic: Secondary | ICD-10-CM | POA: Diagnosis not present

## 2022-08-17 DIAGNOSIS — D2239 Melanocytic nevi of other parts of face: Secondary | ICD-10-CM | POA: Diagnosis not present

## 2022-09-04 DIAGNOSIS — B351 Tinea unguium: Secondary | ICD-10-CM | POA: Diagnosis not present

## 2022-09-04 DIAGNOSIS — M79675 Pain in left toe(s): Secondary | ICD-10-CM | POA: Diagnosis not present

## 2022-09-05 DIAGNOSIS — T1512XA Foreign body in conjunctival sac, left eye, initial encounter: Secondary | ICD-10-CM | POA: Diagnosis not present

## 2022-09-13 DIAGNOSIS — Z1211 Encounter for screening for malignant neoplasm of colon: Secondary | ICD-10-CM | POA: Diagnosis not present

## 2022-09-13 DIAGNOSIS — D125 Benign neoplasm of sigmoid colon: Secondary | ICD-10-CM | POA: Diagnosis not present

## 2022-09-13 DIAGNOSIS — E119 Type 2 diabetes mellitus without complications: Secondary | ICD-10-CM | POA: Diagnosis not present

## 2022-09-13 DIAGNOSIS — R195 Other fecal abnormalities: Secondary | ICD-10-CM | POA: Diagnosis not present

## 2022-09-13 DIAGNOSIS — K5730 Diverticulosis of large intestine without perforation or abscess without bleeding: Secondary | ICD-10-CM | POA: Diagnosis not present

## 2022-09-13 DIAGNOSIS — K635 Polyp of colon: Secondary | ICD-10-CM | POA: Diagnosis not present

## 2022-09-13 DIAGNOSIS — D126 Benign neoplasm of colon, unspecified: Secondary | ICD-10-CM | POA: Diagnosis not present

## 2022-09-13 DIAGNOSIS — J45909 Unspecified asthma, uncomplicated: Secondary | ICD-10-CM | POA: Diagnosis not present

## 2022-09-13 DIAGNOSIS — Z794 Long term (current) use of insulin: Secondary | ICD-10-CM | POA: Diagnosis not present

## 2022-09-13 DIAGNOSIS — K648 Other hemorrhoids: Secondary | ICD-10-CM | POA: Diagnosis not present

## 2022-09-18 DIAGNOSIS — L600 Ingrowing nail: Secondary | ICD-10-CM | POA: Diagnosis not present

## 2022-09-19 DIAGNOSIS — S0002XA Blister (nonthermal) of scalp, initial encounter: Secondary | ICD-10-CM | POA: Diagnosis not present

## 2022-09-19 DIAGNOSIS — L089 Local infection of the skin and subcutaneous tissue, unspecified: Secondary | ICD-10-CM | POA: Diagnosis not present

## 2022-09-21 DIAGNOSIS — E8941 Symptomatic postprocedural ovarian failure: Secondary | ICD-10-CM | POA: Diagnosis not present

## 2022-09-21 DIAGNOSIS — R232 Flushing: Secondary | ICD-10-CM | POA: Diagnosis not present

## 2022-09-21 DIAGNOSIS — E119 Type 2 diabetes mellitus without complications: Secondary | ICD-10-CM | POA: Diagnosis not present

## 2022-09-21 DIAGNOSIS — Z794 Long term (current) use of insulin: Secondary | ICD-10-CM | POA: Diagnosis not present

## 2022-10-07 ENCOUNTER — Other Ambulatory Visit: Payer: Self-pay | Admitting: Allergy and Immunology

## 2022-10-16 ENCOUNTER — Other Ambulatory Visit (HOSPITAL_COMMUNITY): Payer: Self-pay

## 2022-11-01 ENCOUNTER — Ambulatory Visit: Payer: Medicare HMO | Admitting: Allergy and Immunology

## 2022-11-01 ENCOUNTER — Encounter: Payer: Self-pay | Admitting: Allergy and Immunology

## 2022-11-01 VITALS — BP 130/76 | HR 84 | Resp 14 | Ht 65.5 in | Wt 252.6 lb

## 2022-11-01 DIAGNOSIS — T781XXD Other adverse food reactions, not elsewhere classified, subsequent encounter: Secondary | ICD-10-CM

## 2022-11-01 DIAGNOSIS — J4550 Severe persistent asthma, uncomplicated: Secondary | ICD-10-CM | POA: Diagnosis not present

## 2022-11-01 DIAGNOSIS — R0789 Other chest pain: Secondary | ICD-10-CM

## 2022-11-01 DIAGNOSIS — J301 Allergic rhinitis due to pollen: Secondary | ICD-10-CM | POA: Diagnosis not present

## 2022-11-01 DIAGNOSIS — J3089 Other allergic rhinitis: Secondary | ICD-10-CM

## 2022-11-01 NOTE — Progress Notes (Unsigned)
[place] [facility] [place] - [place] - [place] - [place]   Follow-up Note  Referring Provider: Joice Lofts, NP Primary Provider: Joice Lofts, NP Date of Office Visit: 11/01/2022  Subjective:   Meredith Olson (DOB: [birth year]) is a 52 y.o. female who [facility] in re-evaluation of the following:  HPI: Emogean returns to this clinic in evaluation of asthma, allergic rhinitis, food allergy directed against shellfish.  I last saw her in this clinic 03 May 2022.  She believes that she was doing very well although over the course of the past 10 days she has had some intermittent chest tightness.  This appears to be exertional in nature.  When she uses her inhaler rescue medicine it does not really help her very much.  She has been evaluated for a cardiac source of chest discomfort in the past and apparently had an exercise tolerance test which she passed.  She might have some slight coughing but that is not a big component of what is going on at this point in time.  She does not have any wheezing.  She has had very little problems with her upper airway.  She does not consume shellfish.  Allergies as of 11/01/2022       Reactions   Latex Itching   Red rash   Aspirin    Asthma flareup   Farxiga [dapagliflozin] Other (See Comments)   Yeast infections   Ibuprofen    Asthma flare up   Mounjaro [tirzepatide] Diarrhea, Nausea And Vomiting   Ozempic (0.25 Or 0.5 Mg-dose) [semaglutide(0.25 Or 0.5mg -dos)] Nausea And Vomiting   Shellfish Allergy Hives        Medication List    Airsupra 90-80 MCG/ACT Aero Generic drug: Albuterol-Budesonide Inhale 2 Inhalations into the lungs as needed (every four to six hours for cough, wheeze, shortness of breath.  Rinse, gargle, and spit after use).   albuterol 1.25 MG/3ML nebulizer solution Commonly known as: ACCUNEB Take by nebulization.   Breztri Aerosphere 160-9-4.8 MCG/ACT  Aero Generic drug: Budeson-Glycopyrrol-Formoterol INHALE 2 PUFFS WITH SPACER TWICE DAILY TO PREVENT COUGH OR WHEEZING. RINSE, GARGLE, AND SPIT AFTER USE.   busPIRone 5 MG tablet Commonly known as: BUSPAR Take 5 mg by mouth 2 (two) times daily.   DULoxetine 60 MG capsule Commonly known as: CYMBALTA Take 60 mg by mouth daily.   EPINEPHrine 0.3 mg/0.3 mL Soaj injection Commonly known as: EpiPen 2-Pak Use as directed for life-threatening allergic reaction.   gabapentin 300 MG capsule Commonly known as: NEURONTIN Take 300 mg by mouth 3 (three) times daily. Patient only take it once at night   hydrOXYzine 25 MG tablet Commonly known as: ATARAX Take 25 mg by mouth daily as needed.   Insulin Glargine Solostar 100 UNIT/ML Solostar Pen Commonly known as: LANTUS Inject into the skin.   ketoconazole 2 % shampoo Commonly known as: NIZORAL Apply 1 application topically 2 (two) times a week.   loratadine 10 MG tablet Commonly known as: CLARITIN Take 10 mg by mouth daily as needed for allergies.   MAGNESIUM PO Take by mouth daily.   Pataday 0.7 % Soln Generic drug: Olopatadine HCl Can use one drop in each eye once daily if needed for red, itchy, watery eyes.   repaglinide 0.5 MG tablet Commonly known as: PRANDIN Take by mouth.   rOPINIRole 0.25 MG tablet Commonly known as: REQUIP Take 0.5 mg by mouth at bedtime.   SUMAtriptan 100 MG tablet Commonly  known as: IMITREX Take 100 mg by mouth daily as needed.   terbinafine 250 MG tablet Commonly known as: LAMISIL Take 250 mg by mouth daily.   triamcinolone 55 MCG/ACT Aero nasal inhaler Commonly known as: Nasacort Allergy 24HR Can use one to two sprays in each nostril three to seven times per week.   VITAMIN C PO Take by mouth.   VITAMIN D3 PO Take by mouth daily.    Past Medical History:  Diagnosis Date   Anxiety    Asthma    Depression    Diabetes (HCC)    Fibromyalgia    GERD (gastroesophageal reflux disease)     Headache    migraines   Shortness of breath dyspnea    due to asthma    Past Surgical History:  Procedure Laterality Date   BILATERAL SALPINGECTOMY Bilateral 07/28/2015   Procedure: BILATERAL SALPINGO-OOPHORECTOMY;  Surgeon: Lavina Hamman, MD;  Location: [facility];  Service: Gynecology;  Laterality: Bilateral;   CHOLECYSTECTOMY     CYSTOSCOPY N/A 07/28/2015   Procedure: CYSTOSCOPY;  Surgeon: Lavina Hamman, MD;  Location: [facility];  Service: Gynecology;  Laterality: N/A;   TONSILLECTOMY     VAGINAL HYSTERECTOMY N/A 07/28/2015   Procedure: HYSTERECTOMY VAGINAL;  Surgeon: Lavina Hamman, MD;  Location: [facility];  Service: Gynecology;  Laterality: N/A;  MD needs 1.5hrs OR time    Review of systems negative except as noted in HPI / PMHx or noted below:  Review of Systems  Constitutional: Negative.   HENT: Negative.    Eyes: Negative.   Respiratory: Negative.    Cardiovascular: Negative.   Gastrointestinal: Negative.   Genitourinary: Negative.   Musculoskeletal: Negative.   Skin: Negative.   Neurological: Negative.   Endo/Heme/Allergies: Negative.   Psychiatric/Behavioral: Negative.       Objective:   Vitals:   11/01/22 1025  BP: 130/76  Pulse: 84  Resp: 14  SpO2: 98%   Height: 5' 5.5" (166.4 cm)  Weight: 252 lb 9.6 oz (114.6 kg)   Physical Exam Constitutional:      Appearance: She is not diaphoretic.  HENT:     Head: Normocephalic.     Right Ear: Tympanic membrane, ear canal and external ear normal.     Left Ear: Tympanic membrane, ear canal and external ear normal.     Nose: Nose normal. No mucosal edema or rhinorrhea.     Mouth/Throat:     Pharynx: Uvula midline. No oropharyngeal exudate.  Eyes:     Conjunctiva/sclera: Conjunctivae normal.  Neck:     Thyroid: No thyromegaly.     Trachea: Trachea normal. No tracheal tenderness or tracheal deviation.  Cardiovascular:     Rate and Rhythm: Normal rate and regular rhythm.     Heart sounds: Normal heart sounds, S1  normal and S2 normal. No murmur heard. Pulmonary:     Effort: No respiratory distress.     Breath sounds: Normal breath sounds. No stridor. No wheezing or rales.  Lymphadenopathy:     Head:     Right side of head: No tonsillar adenopathy.     Left side of head: No tonsillar adenopathy.     Cervical: No cervical adenopathy.  Skin:    Findings: No erythema or rash.     Nails: There is no clubbing.  Neurological:     Mental Status: She is alert.     Diagnostics:    Spirometry was performed and demonstrated an FEV1 of 2.26 at 80 % of predicted.  Assessment and  Plan:   1. Chest tightness   2. Not well controlled severe persistent asthma   3. Perennial allergic rhinitis   4. Seasonal allergic rhinitis due to pollen   5. Adverse food reaction, subsequent encounter     1.  Allergen avoidance measures - dust mite, cat, dog, pollens, shellfish  2.  Treat and prevent inflammation:  A. Breztri - 2 inhalations 1-2 times per day w/ spacer (empty lungs) B. OTC Nasacort - 1-2 sprays each nostril 3-7 times per week C. Prednisone 10 mg - 1 tablet daily for 7 days only  3.  If needed:  A. Epi-pen, benadryl, MD/ER evaluation for allergic reaction B. AIRSUPRA - 2 inhalations or nebulizer every 4-6 hours C. Loratadine 10 mg or cetirizine 10 mg - 1 tablet 1 time per day D. Pataday - 1 drop each eye 1 time per day  4. Obtain coronary artery morphology CT scan for exertional chest tightness  5. Biologic agent for asthma???  6. Return to clinic in 4 weeks or earlier if problem  Victorian is having chest tightness and there appears to be an exertional component.  Certainly this could be a manifestation of asthma but I think we are obligated to look into her cardiac status is little more detail and we will obtain a cardiac morphology CT scan to examine for possible clinically significant stenosis of her coronary arteries.  If it turns out that this is asthma then she has failed medical therapy  and will consider starting her on a biologic agent.  Laurette Schimke, MD Allergy / Immunology [facility]

## 2022-11-01 NOTE — Patient Instructions (Addendum)
  1.  Allergen avoidance measures - dust mite, cat, dog, pollens, shellfish  2.  Treat and prevent inflammation:  A. Breztri - 2 inhalations 1-2 times per day w/ spacer (empty lungs) B. OTC Nasacort - 1-2 sprays each nostril 3-7 times per week C. Prednisone 10 mg - 1 tablet daily for 7 days only  3.  If needed:  A. Epi-pen, benadryl, MD/ER evaluation for allergic reaction B. AIRSUPRA - 2 inhalations or nebulizer every 4-6 hours C. Loratadine 10 mg or cetirizine 10 mg - 1 tablet 1 time per day D. Pataday - 1 drop each eye 1 time per day  4. Obtain coronary artery morphology CT scan for exertional chest tightness  5. Biologic agent for asthma???  6. Return to clinic in 4 weeks or earlier if problem

## 2022-11-02 ENCOUNTER — Encounter: Payer: Self-pay | Admitting: Allergy and Immunology

## 2022-11-02 DIAGNOSIS — B9689 Other specified bacterial agents as the cause of diseases classified elsewhere: Secondary | ICD-10-CM | POA: Diagnosis not present

## 2022-11-02 DIAGNOSIS — J019 Acute sinusitis, unspecified: Secondary | ICD-10-CM | POA: Diagnosis not present

## 2022-11-08 ENCOUNTER — Telehealth: Payer: Self-pay

## 2022-11-08 DIAGNOSIS — R079 Chest pain, unspecified: Secondary | ICD-10-CM

## 2022-11-08 DIAGNOSIS — R0789 Other chest pain: Secondary | ICD-10-CM

## 2022-11-08 NOTE — Telephone Encounter (Signed)
Sent Epic order for coronary CT angiography scan (CPT [phone]) to [facility].  Authorized from 11/09/22 to 02/09/23, approval number is [id number], tracking number is [id number].

## 2022-11-16 DIAGNOSIS — L03115 Cellulitis of right lower limb: Secondary | ICD-10-CM | POA: Diagnosis not present

## 2022-11-16 DIAGNOSIS — T63421A Toxic effect of venom of ants, accidental (unintentional), initial encounter: Secondary | ICD-10-CM | POA: Diagnosis not present

## 2022-11-28 DIAGNOSIS — F339 Major depressive disorder, recurrent, unspecified: Secondary | ICD-10-CM | POA: Diagnosis not present

## 2022-11-28 DIAGNOSIS — M1990 Unspecified osteoarthritis, unspecified site: Secondary | ICD-10-CM | POA: Diagnosis not present

## 2022-11-28 DIAGNOSIS — Z6841 Body Mass Index (BMI) 40.0 and over, adult: Secondary | ICD-10-CM | POA: Diagnosis not present

## 2022-11-28 DIAGNOSIS — E1169 Type 2 diabetes mellitus with other specified complication: Secondary | ICD-10-CM | POA: Diagnosis not present

## 2022-11-28 DIAGNOSIS — E119 Type 2 diabetes mellitus without complications: Secondary | ICD-10-CM | POA: Diagnosis not present

## 2022-11-28 DIAGNOSIS — Z79899 Other long term (current) drug therapy: Secondary | ICD-10-CM | POA: Diagnosis not present

## 2022-11-28 DIAGNOSIS — E785 Hyperlipidemia, unspecified: Secondary | ICD-10-CM | POA: Diagnosis not present

## 2022-11-28 DIAGNOSIS — E559 Vitamin D deficiency, unspecified: Secondary | ICD-10-CM | POA: Diagnosis not present

## 2022-11-28 DIAGNOSIS — R21 Rash and other nonspecific skin eruption: Secondary | ICD-10-CM | POA: Diagnosis not present

## 2022-11-29 ENCOUNTER — Ambulatory Visit: Payer: Medicare HMO | Admitting: Allergy and Immunology

## 2022-12-08 ENCOUNTER — Encounter (HOSPITAL_COMMUNITY): Payer: Self-pay

## 2022-12-12 DIAGNOSIS — E1165 Type 2 diabetes mellitus with hyperglycemia: Secondary | ICD-10-CM | POA: Diagnosis not present

## 2022-12-12 DIAGNOSIS — Z794 Long term (current) use of insulin: Secondary | ICD-10-CM | POA: Diagnosis not present

## 2022-12-19 DIAGNOSIS — L02811 Cutaneous abscess of head [any part, except face]: Secondary | ICD-10-CM | POA: Diagnosis not present

## 2022-12-20 ENCOUNTER — Other Ambulatory Visit: Payer: Self-pay | Admitting: Allergy and Immunology

## 2023-01-08 DIAGNOSIS — Z794 Long term (current) use of insulin: Secondary | ICD-10-CM | POA: Diagnosis not present

## 2023-01-08 DIAGNOSIS — E1165 Type 2 diabetes mellitus with hyperglycemia: Secondary | ICD-10-CM | POA: Diagnosis not present

## 2023-01-17 DIAGNOSIS — L02612 Cutaneous abscess of left foot: Secondary | ICD-10-CM | POA: Diagnosis not present

## 2023-01-17 DIAGNOSIS — L0390 Cellulitis, unspecified: Secondary | ICD-10-CM | POA: Diagnosis not present

## 2023-01-29 DIAGNOSIS — M79675 Pain in left toe(s): Secondary | ICD-10-CM | POA: Diagnosis not present

## 2023-01-29 DIAGNOSIS — E119 Type 2 diabetes mellitus without complications: Secondary | ICD-10-CM | POA: Diagnosis not present

## 2023-01-29 DIAGNOSIS — Z9889 Other specified postprocedural states: Secondary | ICD-10-CM | POA: Diagnosis not present

## 2023-02-01 DIAGNOSIS — Z794 Long term (current) use of insulin: Secondary | ICD-10-CM | POA: Diagnosis not present

## 2023-02-01 DIAGNOSIS — E1165 Type 2 diabetes mellitus with hyperglycemia: Secondary | ICD-10-CM | POA: Diagnosis not present

## 2023-04-13 ENCOUNTER — Encounter: Payer: Self-pay | Admitting: Allergy and Immunology

## 2023-04-16 ENCOUNTER — Other Ambulatory Visit: Payer: Self-pay | Admitting: Allergy and Immunology

## 2023-04-16 ENCOUNTER — Other Ambulatory Visit: Payer: Self-pay

## 2023-04-16 ENCOUNTER — Other Ambulatory Visit (HOSPITAL_COMMUNITY): Payer: Self-pay

## 2023-04-16 MED ORDER — AIRSUPRA 90-80 MCG/ACT IN AERO: 2.0000 | INHALATION_SPRAY | RESPIRATORY_TRACT | 0 refills | Status: AC | PRN

## 2023-04-17 NOTE — Telephone Encounter (Signed)
 Can we please try a PA? Patient has tried and failed Albuterol and Ventolin.

## 2023-04-18 ENCOUNTER — Other Ambulatory Visit (HOSPITAL_COMMUNITY): Payer: Self-pay

## 2023-04-18 ENCOUNTER — Telehealth: Payer: Self-pay

## 2023-04-18 NOTE — Telephone Encounter (Signed)
 PA request has been Submitted. New Encounter created for follow up. For additional info see Pharmacy Prior Auth telephone encounter from 01/15.

## 2023-04-18 NOTE — Telephone Encounter (Signed)
 Pharmacy Patient Advocate Encounter  Received notification from [employer] that Prior Authorization for Airsupra  has been APPROVED from 04/18/2023 to 04/02/2024. Ran test claim, Copay is $192.54. This test claim was processed through [facility]- copay amounts may vary at other pharmacies due to pharmacy/plan contracts, or as the patient moves through the different stages of their insurance plan.

## 2023-04-18 NOTE — Telephone Encounter (Signed)
*  Asthma/Allergy   Pharmacy Patient Advocate Encounter   Received notification from RX Request Messages that prior authorization for Airsupra  90-80MCG/ACT aerosol  is required/requested.   Insurance verification completed.   The patient is insured through [facility] ADVANT[employer]/RX ADVANCE .   Per test claim: PA required; PA submitted to above mentioned insurance via CoverMyMeds Key/confirmation #/EOC [id number] Status is pending

## 2023-12-18 ENCOUNTER — Other Ambulatory Visit: Payer: Self-pay | Admitting: Allergy and Immunology
